# Patient Record
Sex: Male | Born: 1975 | Race: White | Hispanic: No | Smoking: Former smoker
Health system: Southern US, Community
[De-identification: ages and names within clinical notes are randomized; demographics above are authoritative.]

## PROBLEM LIST (undated history)

## (undated) DIAGNOSIS — E119 Type 2 diabetes mellitus without complications: Secondary | ICD-10-CM

## (undated) HISTORY — DX: Type 2 diabetes mellitus without complications: E11.9

---

## 2022-01-06 ENCOUNTER — Ambulatory Visit: Payer: Self-pay

## 2022-01-16 ENCOUNTER — Inpatient Hospital Stay
Admission: RE | Admit: 2022-01-16 | Discharge: 2022-01-29 | Disposition: A | Discharge status: Home / Self Care | Payer: BC Managed Care – PPO

## 2022-01-16 DIAGNOSIS — R609 Edema, unspecified: Secondary | ICD-10-CM

## 2022-01-16 LAB — VANCOMYCIN, RANDOM: Vancomycin Rm: 21

## 2022-01-17 LAB — COMPREHENSIVE METABOLIC PANEL
ALT: 28 U/L (ref 0–44)
AST: 33 U/L (ref 15–41)
Albumin: 1.7 g/dL — ABNORMAL LOW (ref 3.5–5.0)
Alkaline Phosphatase: 65 U/L (ref 38–126)
Anion gap: 8 (ref 5–15)
BUN: <5 mg/dL — ABNORMAL LOW (ref 6–20)
CO2: 30 mmol/L (ref 22–32)
Calcium: 7.9 mg/dL — ABNORMAL LOW (ref 8.9–10.3)
Chloride: 98 mmol/L (ref 98–111)
Creatinine, Ser: 0.63 mg/dL (ref 0.61–1.24)
GFR, Estimated: >60 mL/min (ref >60)
Glucose, Bld: 125 mg/dL — ABNORMAL HIGH (ref 70–99)
Potassium: 3.5 mmol/L (ref 3.5–5.1)
Sodium: 136 mmol/L (ref 135–145)
Total Bilirubin: 0.4 mg/dL (ref 0.3–1.2)
Total Protein: 6.5 g/dL (ref 6.5–8.1)

## 2022-01-17 LAB — CBC WITH DIFFERENTIAL/PLATELET
Abs Immature Granulocytes: 0.07 10*3/uL (ref 0.00–0.07)
Basophils Absolute: 0.1 10*3/uL (ref 0.0–0.1)
Basophils Relative: 1 %
Eosinophils Absolute: 0.2 10*3/uL (ref 0.0–0.5)
Eosinophils Relative: 2 %
HCT: 30.8 % — ABNORMAL LOW (ref 39.0–52.0)
Hemoglobin: 9.6 g/dL — ABNORMAL LOW (ref 13.0–17.0)
Immature Granulocytes: 1 %
Lymphocytes Relative: 36 %
Lymphs Abs: 4.5 10*3/uL — ABNORMAL HIGH (ref 0.7–4.0)
MCH: 27.7 pg (ref 26.0–34.0)
MCHC: 31.2 g/dL (ref 30.0–36.0)
MCV: 88.8 fL (ref 80.0–100.0)
Monocytes Absolute: 1 10*3/uL (ref 0.1–1.0)
Monocytes Relative: 8 %
Neutro Abs: 6.6 10*3/uL (ref 1.7–7.7)
Neutrophils Relative %: 52 %
Platelets: 485 10*3/uL — ABNORMAL HIGH (ref 150–400)
RBC: 3.47 MIL/uL — ABNORMAL LOW (ref 4.22–5.81)
RDW: 14.1 % (ref 11.5–15.5)
WBC: 12.6 10*3/uL — ABNORMAL HIGH (ref 4.0–10.5)
nRBC: 0 % (ref 0.0–0.2)

## 2022-01-18 LAB — VANCOMYCIN, TROUGH: Vancomycin Tr: 13 ug/mL — ABNORMAL LOW (ref 15–20)

## 2022-01-18 LAB — HEMOGLOBIN A1C
Hgb A1c MFr Bld: 11.4 % — ABNORMAL HIGH (ref 4.8–5.6)
Mean Plasma Glucose: 280 mg/dL

## 2022-01-19 LAB — HEMOGLOBIN A1C
Hgb A1c MFr Bld: 10.4 % — ABNORMAL HIGH (ref 4.8–5.6)
Mean Plasma Glucose: 251.78 mg/dL

## 2022-01-19 LAB — CBC
HCT: 31.7 % — ABNORMAL LOW (ref 39.0–52.0)
Hemoglobin: 10.2 g/dL — ABNORMAL LOW (ref 13.0–17.0)
MCH: 28.3 pg (ref 26.0–34.0)
MCHC: 32.2 g/dL (ref 30.0–36.0)
MCV: 87.8 fL (ref 80.0–100.0)
Platelets: 448 10*3/uL — ABNORMAL HIGH (ref 150–400)
RBC: 3.61 MIL/uL — ABNORMAL LOW (ref 4.22–5.81)
RDW: 13.7 % (ref 11.5–15.5)
WBC: 11.5 10*3/uL — ABNORMAL HIGH (ref 4.0–10.5)
nRBC: 0 % (ref 0.0–0.2)

## 2022-01-19 LAB — BASIC METABOLIC PANEL
Anion gap: 5 (ref 5–15)
BUN: 7 mg/dL (ref 6–20)
CO2: 31 mmol/L (ref 22–32)
Calcium: 8.1 mg/dL — ABNORMAL LOW (ref 8.9–10.3)
Chloride: 100 mmol/L (ref 98–111)
Creatinine, Ser: 0.57 mg/dL — ABNORMAL LOW (ref 0.61–1.24)
GFR, Estimated: >60 mL/min (ref >60)
Glucose, Bld: 143 mg/dL — ABNORMAL HIGH (ref 70–99)
Potassium: 4.2 mmol/L (ref 3.5–5.1)
Sodium: 136 mmol/L (ref 135–145)

## 2022-01-22 LAB — CBC
HCT: 34.6 % — ABNORMAL LOW (ref 39.0–52.0)
Hemoglobin: 11.3 g/dL — ABNORMAL LOW (ref 13.0–17.0)
MCH: 28.7 pg (ref 26.0–34.0)
MCHC: 32.7 g/dL (ref 30.0–36.0)
MCV: 87.8 fL (ref 80.0–100.0)
Platelets: 460 10*3/uL — ABNORMAL HIGH (ref 150–400)
RBC: 3.94 MIL/uL — ABNORMAL LOW (ref 4.22–5.81)
RDW: 13.7 % (ref 11.5–15.5)
WBC: 8.9 10*3/uL (ref 4.0–10.5)
nRBC: 0 % (ref 0.0–0.2)

## 2022-01-22 LAB — BASIC METABOLIC PANEL
Anion gap: 6 (ref 5–15)
BUN: <5 mg/dL — ABNORMAL LOW (ref 6–20)
CO2: 31 mmol/L (ref 22–32)
Calcium: 8.4 mg/dL — ABNORMAL LOW (ref 8.9–10.3)
Chloride: 99 mmol/L (ref 98–111)
Creatinine, Ser: 0.58 mg/dL — ABNORMAL LOW (ref 0.61–1.24)
GFR, Estimated: >60 mL/min (ref >60)
Glucose, Bld: 158 mg/dL — ABNORMAL HIGH (ref 70–99)
Potassium: 4.2 mmol/L (ref 3.5–5.1)
Sodium: 136 mmol/L (ref 135–145)

## 2022-01-22 LAB — VANCOMYCIN, TROUGH: Vancomycin Tr: 13 ug/mL — ABNORMAL LOW (ref 15–20)

## 2022-01-24 ENCOUNTER — Other Ambulatory Visit (HOSPITAL_COMMUNITY): Payer: BC Managed Care – PPO

## 2022-01-26 LAB — BASIC METABOLIC PANEL
Anion gap: 9 (ref 5–15)
BUN: 7 mg/dL (ref 6–20)
CO2: 29 mmol/L (ref 22–32)
Calcium: 8.3 mg/dL — ABNORMAL LOW (ref 8.9–10.3)
Chloride: 99 mmol/L (ref 98–111)
Creatinine, Ser: 0.58 mg/dL — ABNORMAL LOW (ref 0.61–1.24)
GFR, Estimated: >60 mL/min (ref >60)
Glucose, Bld: 101 mg/dL — ABNORMAL HIGH (ref 70–99)
Potassium: 4 mmol/L (ref 3.5–5.1)
Sodium: 137 mmol/L (ref 135–145)

## 2022-01-26 LAB — CBC
HCT: 33.2 % — ABNORMAL LOW (ref 39.0–52.0)
Hemoglobin: 10.6 g/dL — ABNORMAL LOW (ref 13.0–17.0)
MCH: 27.7 pg (ref 26.0–34.0)
MCHC: 31.9 g/dL (ref 30.0–36.0)
MCV: 86.9 fL (ref 80.0–100.0)
Platelets: 339 10*3/uL (ref 150–400)
RBC: 3.82 MIL/uL — ABNORMAL LOW (ref 4.22–5.81)
RDW: 13.5 % (ref 11.5–15.5)
WBC: 8.7 10*3/uL (ref 4.0–10.5)
nRBC: 0 % (ref 0.0–0.2)

## 2022-01-29 LAB — VANCOMYCIN, TROUGH: Vancomycin Tr: 14 ug/mL — ABNORMAL LOW (ref 15–20)

## 2022-02-20 ENCOUNTER — Encounter: Payer: Self-pay | Admitting: "Endocrinology

## 2022-02-20 ENCOUNTER — Ambulatory Visit: Payer: BC Managed Care – PPO | Admitting: "Endocrinology

## 2022-02-20 VITALS — BP 96/64 | HR 99 | Ht 73.0 in | Wt 254.6 lb

## 2022-02-20 DIAGNOSIS — E782 Mixed hyperlipidemia: Secondary | ICD-10-CM

## 2022-02-20 DIAGNOSIS — E6609 Other obesity due to excess calories: Secondary | ICD-10-CM

## 2022-02-20 DIAGNOSIS — E1165 Type 2 diabetes mellitus with hyperglycemia: Secondary | ICD-10-CM | POA: Diagnosis not present

## 2022-02-20 DIAGNOSIS — Z6833 Body mass index (BMI) 33.0-33.9, adult: Secondary | ICD-10-CM

## 2022-02-20 NOTE — Progress Notes (Signed)
Endocrinology Consult Note       02/20/2022, 4:46 PM   Subjective:    Patient ID: Dakota Matthews, male    DOB: [birth year].  Dakota Matthews is being seen in consultation for management of currently uncontrolled symptomatic diabetes requested by  Wilburt Finlay, MD.   Past Medical History:  Diagnosis Date   Diabetes [facility])     History reviewed. No pertinent surgical history.  Social History   Socioeconomic History   Marital status: Divorced    Spouse name: Not on file   Number of children: Not on file   Years of education: Not on file   Highest education level: Not on file  Occupational History   Not on file  Tobacco Use   Smoking status: Former    Types: Cigarettes    Quit date: 04/23/2021    Years since quitting: 0.8   Smokeless tobacco: Current  Vaping Use   Vaping Use: Never used  Substance and Sexual Activity   Alcohol use: Yes    Alcohol/week: 1.0 standard drink    Types: 1 Cans of beer per week    Comment: Occ, stopped in May   Drug use: Never   Sexual activity: Not on file  Other Topics Concern   Not on file  Social History Narrative   Not on file   Social Determinants of Health   Financial Resource Strain: Not on file  Food Insecurity: Not on file  Transportation Needs: Not on file  Physical Activity: Not on file  Stress: Not on file  Social Connections: Not on file    Family History  Problem Relation Age of Onset   Cancer Father     Outpatient Encounter Medications as of 02/20/2022  Medication Sig   atorvastatin (LIPITOR) 20 MG tablet Take 20 mg by mouth daily.   BD INSULIN SYRINGE U/F 30G X 1/2" 0.5 ML MISC USE ONCE DAILY WITH INSULIN   ceFEPIme 2 g in sodium chloride 0.9 % 100 mL Inject 2 g into the vein every 12 (twelve) hours.   Docusate Sodium (DSS) 100 MG CAPS Take by mouth.   ELIQUIS 5 MG TABS tablet Take 5 mg by mouth 2 (two) times daily.   Famotidine (PEPCID  PO) Take by mouth as needed.   HYDROcodone-acetaminophen (NORCO/VICODIN) 5-325 MG tablet Take 1 tablet by mouth every 6 (six) hours as needed.   insulin glargine (LANTUS) 100 UNIT/ML injection Inject 20 Units into the skin at bedtime.   metFORMIN (GLUCOPHAGE) 500 MG tablet Take 1,000 mg by mouth 2 (two) times daily.   VANCOMYCIN HCL PO Take by mouth. Every 12 hours   vitamin C (ASCORBIC ACID) 500 MG tablet Take 500 mg by mouth daily.   Zinc 50 MG TABS Take 1 tablet by mouth daily.   No facility-administered encounter medications on file as of 02/20/2022.    ALLERGIES: No Known Allergies  VACCINATION STATUS:  There is no immunization history on file for this patient.  Diabetes He presents for his initial diabetic visit. He has type 2 diabetes mellitus. Onset time: He was diagnosed in January 2023 at age of 19. There  are no hypoglycemic associated symptoms. Pertinent negatives for hypoglycemia include no confusion, headaches, pallor or seizures. Associated symptoms include polydipsia and polyuria. Pertinent negatives for diabetes include no chest pain, no fatigue, no polyphagia and no weakness. There are no hypoglycemic complications. Symptoms are improving. Diabetic complications include peripheral neuropathy and PVD. (Patient presented with left foot diabetic ulcer which was complicated as osteomyelitis requiring several weeks of antibiotic treatment.  He is currently on a wheelchair due to deconditioning from this incident.  He was discharged on insulin treatment which helped control his glycemic profile towards target.) Risk factors for coronary artery disease include dyslipidemia, diabetes mellitus, male sex, obesity, sedentary lifestyle and tobacco exposure. Current diabetic treatments: He is on Lantus 30 units nightly and metformin 1000 mg twice a day. His weight is fluctuating minimally. He is following a generally unhealthy diet. When asked about meal planning, he reported none. He has not  had a previous visit with a dietitian. He never participates in exercise. His breakfast blood glucose range is generally 110-130 mg/dl. His bedtime blood glucose range is generally 130-140 mg/dl. His overall blood glucose range is 130-140 mg/dl. (His most recent glycemic profile is controlled averaging 125-150 mg per DL.  No major hypoglycemia.) An ACE inhibitor/angiotensin II receptor blocker is not being taken.  Hyperlipidemia This is a chronic problem. The current episode started more than 1 month ago. Exacerbating diseases include diabetes and obesity. Pertinent negatives include no chest pain, myalgias or shortness of breath. Current antihyperlipidemic treatment includes statins. Risk factors for coronary artery disease include diabetes mellitus, dyslipidemia, family history, obesity, male sex, hypertension and a sedentary lifestyle.    Review of Systems  Constitutional:  Negative for chills, fatigue, fever and unexpected weight change.  HENT:  Negative for dental problem, mouth sores and trouble swallowing.   Eyes:  Negative for visual disturbance.  Respiratory:  Negative for cough, choking, chest tightness, shortness of breath and wheezing.   Cardiovascular:  Negative for chest pain, palpitations and leg swelling.  Gastrointestinal:  Negative for abdominal distention, abdominal pain, constipation, diarrhea, nausea and vomiting.  Endocrine: Positive for polydipsia and polyuria. Negative for polyphagia.  Genitourinary:  Negative for dysuria, flank pain, hematuria and urgency.  Musculoskeletal:  Negative for back pain, gait problem, myalgias and neck pain.       Patient is on a wheelchair due to his recent lower extremity cellulitis/osteomyelitis.  Skin:  Negative for pallor, rash and wound.  Neurological:  Negative for seizures, syncope, weakness, numbness and headaches.  Psychiatric/Behavioral:  Negative for confusion and dysphoric mood.    Objective:    Vitals with BMI 02/20/2022  Height  6\' 1"   Weight 254 lbs 10 oz  BMI 123XX123  Systolic 96  Diastolic 64  Pulse 99    BP 96/64    Pulse 99    Ht 6\' 1"  (1.854 m)    Wt 254 lb 9.6 oz (115.5 kg)    SpO2 100%    BMI 33.59 kg/m   Wt Readings from Last 3 Encounters:  02/20/22 254 lb 9.6 oz (115.5 kg)     Physical Exam Constitutional:      General: He is not in acute distress.    Appearance: He is well-developed.  HENT:     Head: Normocephalic and atraumatic.  Neck:     Thyroid: No thyromegaly.     Trachea: No tracheal deviation.  Cardiovascular:     Rate and Rhythm: Normal rate.     Pulses:  Dorsalis pedis pulses are 1+ on the right side and 1+ on the left side.       Posterior tibial pulses are 1+ on the right side and 1+ on the left side.     Heart sounds: Normal heart sounds, S1 normal and S2 normal. No murmur heard.   No gallop.  Pulmonary:     Effort: Pulmonary effort is normal. No respiratory distress.     Breath sounds: No wheezing.  Abdominal:     General: Bowel sounds are normal. There is no distension.     Palpations: Abdomen is soft.     Tenderness: There is no abdominal tenderness. There is no guarding.  Musculoskeletal:     Right shoulder: No swelling or deformity.     Cervical back: Normal range of motion and neck supple.     Comments: Bilateral lower extremity dressed and bandaged due to complicated diabetic foot ulcer by osteomyelitis.  He is currently on antibiotic treatment.  Skin:    General: Skin is warm and dry.     Findings: No rash.     Nails: There is no clubbing.  Neurological:     Mental Status: He is alert and oriented to person, place, and time.     Cranial Nerves: No cranial nerve deficit.     Sensory: No sensory deficit.     Gait: Gait normal.     Deep Tendon Reflexes: Reflexes are normal and symmetric.  Psychiatric:        Speech: Speech normal.        Behavior: Behavior normal. Behavior is cooperative.        Thought Content: Thought content normal.        Judgment:  Judgment normal.      CMP ( most recent) CMP     Component Value Date/Time   NA 137 01/26/2022 0400   K 4.0 01/26/2022 0400   CL 99 01/26/2022 0400   CO2 29 01/26/2022 0400   GLUCOSE 101 (H) 01/26/2022 0400   BUN 7 01/26/2022 0400   CREATININE 0.58 (L) 01/26/2022 0400   CALCIUM 8.3 (L) 01/26/2022 0400   PROT 6.5 01/17/2022 0434   ALBUMIN 1.7 (L) 01/17/2022 0434   AST 33 01/17/2022 0434   ALT 28 01/17/2022 0434   ALKPHOS 65 01/17/2022 0434   BILITOT 0.4 01/17/2022 0434   GFRNONAA >60 01/26/2022 0400     Diabetic Labs (most recent): Lab Results  Component Value Date   HGBA1C 10.4 (H) 01/19/2022   HGBA1C 11.4 (H) 01/17/2022     Assessment & Plan:   1. Poorly controlled type 2 diabetes mellitus ([place])   - Donique Billick has currently uncontrolled symptomatic type 2 DM since  46 years of age,  with most recent A1c of 10.4 %. Recent labs reviewed. He was diagnosed with diabetes with A1c of 11.4% January 2023.  Admittedly, he has not seen primary care doctor in several years prior to that. - I had a long discussion with him about the progressive nature of diabetes and the pathology behind its complications. -his diabetes is complicated by diabetic foot ulcer complicated by osteomyelitis requiring debridement as well as ongoing antibiotic treatment and he remains at a high risk for more acute and chronic complications which include CAD, CVA, CKD, retinopathy, and neuropathy. These are all discussed in detail with him.  - I discussed all available options of managing his diabetes including de-escalation of medications. I have counseled him on diet  and weight management  by adopting a Whole Food , Plant Predominant  ( WFPP) nutrition as recommended by [employer] of Lifestyle Medicine. Patient is encouraged to switch to  unprocessed or minimally processed  complex starch, adequate protein intake (mainly plant source), minimal liquid fat ( mainly vegetable oils), plenty of  fruits, and vegetables. -  he is advised to stick to a routine mealtimes to eat 3 complete meals a day and snack only when necessary ( to snack only to correct hypoglycemia BG <70 day time or <100 at night).   - he acknowledges that there is a room for improvement in his food and drink choices. - Further Specific Suggestion is made for him to avoid simple carbohydrates  from his diet including Cakes, Sweet Desserts, Ice Cream, Soda (diet and regular), Sweet Tea, Candies, Chips, Cookies, Store Bought Juices, Alcohol ,  Artificial Sweeteners,  Coffee Creamer, and "Sugar-free" Products. This will help patient to have more stable blood glucose profile and potentially avoid unintended weight gain.  The following Lifestyle Medicine recommendations according to [facility]) were discussed and offered to patient and he agrees to start the journey:  A. Whole Foods, Plant-based plate comprising of fruits and vegetables, plant-based proteins, whole-grain carbohydrates was discussed in detail with the patient.   A list for source of those nutrients were also provided to the patient.  Patient will use only water or unsweetened tea for hydration. B.  The need to stay away from risky substances including alcohol, smoking; obtaining 7 to 9 hours of restorative sleep, at least 150 minutes of moderate intensity exercise weekly, the importance of healthy social connections,  and stress reduction techniques were discussed. C.  A full color page of  Calorie density of various food groups per pound showing examples of each food groups was provided to the patient.  - he will be scheduled with Jearld Fenton, RDN, CDE for individualized diabetes education.  - I have approached him with the following plan to manage  his diabetes and patient agrees:   -In light of his presentation with near target glycemic profile, he will not need prandial insulin for now.  He is advised to lower his Lantus to  20 units nightly, associated with monitoring of blood glucose twice a day-daily before breakfast and at bedtime.  - he is encouraged to call clinic for blood glucose levels less than 70 or above 200 mg /dl. - he is advised to continue metformin 1000 mg p.o. twice daily, therapeutically suitable for patient .  - he will be considered for incretin therapy as appropriate next visit.  - Specific targets for  A1c;  LDL, HDL,  and Triglycerides were discussed with the patient.  2) Blood Pressure /Hypertension:  his blood pressure is  controlled to target.  He is not on antihypertensive medications due to marginal blood pressure.   3) Lipids/Hyperlipidemia: I do not have his recent lipid panel to review.  He is on statin, atorvastatin 20 mg p.o. nightly.  He is advised to continue.    Side effects and precautions discussed with him.  The detailed WF PB diet above will help with dyslipidemia as well.  4)  Weight/Diet:  Body mass index is 33.59 kg/m.  -   clearly complicating his diabetes care.   he is  a candidate for weight loss. I discussed with him the fact that loss of 5 - 10% of his  current body weight will have the most impact on his diabetes management.  The above detailed  ACLM recommendations for nutrition, exercise, sleep, social life, avoidance of risky substances, the need for restorative sleep   information will also detailed on discharge instructions.  5) Chronic Care/Health Maintenance:  -he  is Statin medications and  is encouraged to initiate and continue to follow up with Ophthalmology, Dentist,  Podiatrist at least yearly or according to recommendations, and advised to   stay away from smoking. I have recommended yearly flu vaccine and pneumonia vaccine at least every 5 years; moderate intensity exercise for up to 150 minutes weekly; and  sleep for 7- 9 hours a day.  - he is  advised to maintain close follow up with [place], Tyler Deis, MD for primary care needs, as well as his other  providers for optimal and coordinated care.   I spent 66 minutes in the care of the patient today including review of labs from [facility], Lipids, Thyroid Function, Hematology (current and previous including abstractions from other facilities); face-to-face time discussing  his blood glucose readings/logs, discussing hypoglycemia and hyperglycemia episodes and symptoms, medications doses, his options of short and long term treatment based on the latest standards of care / guidelines;  discussion about incorporating lifestyle medicine;  and documenting the encounter.     Please refer to Patient Instructions for Blood Glucose Monitoring and Insulin/Medications Dosing Guide"  in media tab for additional information. Please  also refer to " Patient Self Inventory" in the Media  tab for reviewed elements of pertinent patient history.  Debera Lat participated in the discussions, expressed understanding, and voiced agreement with the above plans.  All questions were answered to his satisfaction. he is encouraged to contact clinic should he have any questions or concerns prior to his return visit.   Follow up plan: - Return in about 9 weeks (around 04/24/2022) for F/U with Pre-visit Labs, Meter, Logs, A1c here.Glade Lloyd, MD [facility] [address] [place], [place] [address] Phone: [phone]  Fax: [phone]    02/20/2022, 4:46 PM  This note was partially dictated with voice recognition software. Similar sounding words can be transcribed inadequately or may not  be corrected upon review.

## 2022-02-20 NOTE — Patient Instructions (Signed)

## 2022-03-08 ENCOUNTER — Telehealth: Payer: Self-pay | Admitting: "Endocrinology

## 2022-03-08 NOTE — Telephone Encounter (Signed)
Patient's caregiver, [name] called and said someone from the office called his sister yesterday about a RX being sent in for his Lantus 20 units but nothing was called in. I do not see anything in the chart regarding this. She said that she only has enough to give him for tonight. Can this be called into [facility]. If you have any questions please call [name] his caregiver at [phone] ?

## 2022-03-08 NOTE — Telephone Encounter (Signed)
Left a message requesting pt's caregiver [name] return call to the office. ?

## 2022-03-09 ENCOUNTER — Other Ambulatory Visit: Payer: Self-pay

## 2022-03-09 DIAGNOSIS — E1165 Type 2 diabetes mellitus with hyperglycemia: Secondary | ICD-10-CM

## 2022-03-09 MED ORDER — INSULIN GLARGINE 100 UNIT/ML SOLOSTAR PEN: 20.0000 [IU] | PEN_INJECTOR | Freq: Every day | SUBCUTANEOUS | 1 refills | Status: DC

## 2022-03-09 MED ORDER — INSULIN GLARGINE 100 UNIT/ML ~~LOC~~ SOLN: 20.0000 [IU] | Freq: Every day | SUBCUTANEOUS | 1 refills | Status: DC

## 2022-03-09 MED ORDER — PEN NEEDLES 31G X 8 MM MISC: 1 refills | Status: DC

## 2022-03-09 NOTE — Telephone Encounter (Signed)
Talked with pt's caregiver, Rx for lantus solostar pen and pen needles sent in to [facility]. ?

## 2022-03-09 NOTE — Telephone Encounter (Signed)
[name] would like a call back at [phone] ?

## 2022-03-24 HISTORY — PX: BELOW KNEE LEG AMPUTATION: SUR23

## 2022-04-20 LAB — COMPREHENSIVE METABOLIC PANEL
ALT: 27 IU/L (ref 0–44)
AST: 22 IU/L (ref 0–40)
Albumin/Globulin Ratio: 1.7 (ref 1.2–2.2)
Albumin: 4.3 g/dL (ref 4.0–5.0)
Alkaline Phosphatase: 63 IU/L (ref 44–121)
BUN/Creatinine Ratio: 17 (ref 9–20)
BUN: 14 mg/dL (ref 6–24)
Bilirubin Total: 0.4 mg/dL (ref 0.0–1.2)
CO2: 26 mmol/L (ref 20–29)
Calcium: 9.4 mg/dL (ref 8.7–10.2)
Chloride: 101 mmol/L (ref 96–106)
Creatinine, Ser: 0.82 mg/dL (ref 0.76–1.27)
Globulin, Total: 2.6 g/dL (ref 1.5–4.5)
Glucose: 95 mg/dL (ref 70–99)
Potassium: 4.3 mmol/L (ref 3.5–5.2)
Sodium: 140 mmol/L (ref 134–144)
Total Protein: 6.9 g/dL (ref 6.0–8.5)
eGFR: 110 mL/min/{1.73_m2} (ref >59)

## 2022-04-20 LAB — LIPID PANEL
Chol/HDL Ratio: 2.5 ratio (ref 0.0–5.0)
Cholesterol, Total: 99 mg/dL — ABNORMAL LOW (ref 100–199)
HDL: 39 mg/dL — ABNORMAL LOW (ref >39)
LDL Chol Calc (NIH): 39 mg/dL (ref 0–99)
Triglycerides: 118 mg/dL (ref 0–149)
VLDL Cholesterol Cal: 21 mg/dL (ref 5–40)

## 2022-04-20 LAB — T4, FREE: Free T4: 0.92 ng/dL (ref 0.82–1.77)

## 2022-04-20 LAB — TSH: TSH: 9.36 u[IU]/mL — ABNORMAL HIGH (ref 0.450–4.500)

## 2022-04-20 LAB — VITAMIN D 25 HYDROXY (VIT D DEFICIENCY, FRACTURES): Vit D, 25-Hydroxy: 26.4 ng/mL — ABNORMAL LOW (ref 30.0–100.0)

## 2022-04-24 ENCOUNTER — Ambulatory Visit (INDEPENDENT_AMBULATORY_CARE_PROVIDER_SITE_OTHER): Payer: BC Managed Care – PPO | Admitting: "Endocrinology

## 2022-04-24 ENCOUNTER — Encounter: Payer: Self-pay | Admitting: "Endocrinology

## 2022-04-24 VITALS — BP 116/82 | HR 92 | Ht 73.0 in

## 2022-04-24 DIAGNOSIS — E782 Mixed hyperlipidemia: Secondary | ICD-10-CM

## 2022-04-24 DIAGNOSIS — E6609 Other obesity due to excess calories: Secondary | ICD-10-CM

## 2022-04-24 DIAGNOSIS — E1165 Type 2 diabetes mellitus with hyperglycemia: Secondary | ICD-10-CM

## 2022-04-24 DIAGNOSIS — Z6833 Body mass index (BMI) 33.0-33.9, adult: Secondary | ICD-10-CM

## 2022-04-24 DIAGNOSIS — E039 Hypothyroidism, unspecified: Secondary | ICD-10-CM | POA: Diagnosis not present

## 2022-04-24 MED ORDER — LEVOTHYROXINE SODIUM 25 MCG PO TABS
25.0000 ug | ORAL_TABLET | Freq: Every day | ORAL | 1 refills | Status: DC
Start: 2022-04-24 — End: 2022-08-03

## 2022-04-24 NOTE — Progress Notes (Signed)
? ?                                                 ?     04/24/2022, 6:53 PM ? ?Endocrinology follow-up note ? ? ?Subjective:  ? ? Patient ID: Dakota Matthews, male    DOB: 02/12/1976.  ?Dakota Matthews is being seen in follow-up after he was seen in consultation for management of currently uncontrolled symptomatic diabetes requested by  Beatrix FettersKotturi, Vinay K, MD. ? ? ?Past Medical History:  ?Diagnosis Date  ? Diabetes (HCC)   ? ? ?Past Surgical History:  ?Procedure Laterality Date  ? BELOW KNEE LEG AMPUTATION Left 03/2022  ? ? ?Social History  ? ?Socioeconomic History  ? Marital status: Divorced  ?  Spouse name: Not on file  ? Number of children: Not on file  ? Years of education: Not on file  ? Highest education level: Not on file  ?Occupational History  ? Not on file  ?Tobacco Use  ? Smoking status: Former  ?  Types: Cigarettes  ?  Quit date: 04/23/2021  ?  Years since quitting: 1.0  ? Smokeless tobacco: Current  ?Vaping Use  ? Vaping Use: Never used  ?Substance and Sexual Activity  ? Alcohol use: Yes  ?  Alcohol/week: 1.0 standard drink  ?  Types: 1 Cans of beer per week  ?  Comment: Occ, stopped in May  ? Drug use: Never  ? Sexual activity: Not on file  ?Other Topics Concern  ? Not on file  ?Social History Narrative  ? Not on file  ? ?Social Determinants of Health  ? ?Financial Resource Strain: Not on file  ?Food Insecurity: Not on file  ?Transportation Needs: Not on file  ?Physical Activity: Not on file  ?Stress: Not on file  ?Social Connections: Not on file  ? ? ?Family History  ?Problem Relation Age of Onset  ? Cancer Father   ? ? ?Outpatient Encounter Medications as of 04/24/2022  ?Medication Sig  ? levothyroxine (SYNTHROID) 25 MCG tablet Take 1 tablet (25 mcg total) by mouth daily before breakfast.  ? [DISCONTINUED] Insulin Glargine (BASAGLAR KWIKPEN) 100 UNIT/ML Inject 10 Units into the skin at bedtime.  ? atorvastatin (LIPITOR) 20 MG tablet Take 20 mg by mouth daily.  ? BD INSULIN SYRINGE U/F 30G X  1/2" 0.5 ML MISC USE ONCE DAILY WITH INSULIN  ? ceFEPIme 2 g in sodium chloride 0.9 % 100 mL Inject 2 g into the vein every 12 (twelve) hours.  ? Docusate Sodium (DSS) 100 MG CAPS Take by mouth.  ? HYDROcodone-acetaminophen (NORCO/VICODIN) 5-325 MG tablet Take 1 tablet by mouth every 6 (six) hours as needed.  ? Insulin Pen Needle (PEN NEEDLES) 31G X 8 MM MISC Use to inject insulin daily at bedtime  ? metFORMIN (GLUCOPHAGE) 500 MG tablet Take 1,000 mg by mouth 2 (two) times daily.  ? vitamin C (ASCORBIC ACID) 500 MG tablet Take 500 mg by mouth daily.  ? [DISCONTINUED] ELIQUIS 5 MG TABS tablet Take 5 mg by mouth 2 (two) times daily.  ? [DISCONTINUED] Famotidine (PEPCID PO) Take by mouth as needed.  ? [DISCONTINUED] insulin glargine (LANTUS) 100 UNIT/ML injection Inject 0.2 mLs (20 Units total) into the skin at bedtime.  ? [DISCONTINUED] VANCOMYCIN HCL PO Take by mouth. Every 12 hours  ? [DISCONTINUED] Zinc 50 MG  TABS Take 1 tablet by mouth daily.  ? ?No facility-administered encounter medications on file as of 04/24/2022.  ? ? ?ALLERGIES: ?No Known Allergies ? ?VACCINATION STATUS: ? ?There is no immunization history on file for this patient. ? ?Diabetes ?He presents for his follow-up diabetic visit. He has type 2 diabetes mellitus. Onset time: He was diagnosed in January 2023 at age of 77. 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 There are no hypoglycemic associated symptoms. Pertinent negatives for hypoglycemia include no confusion, headaches, pallor or seizures. Pertinent negatives for diabetes include no chest pain, no fatigue, no polydipsia, no polyphagia, no polyuria and no weakness. There are no hypoglycemic complications. Symptoms are improving. Diabetic complications include peripheral neuropathy and PVD. (He underwent left BKA after complicated osteomyelitis on March 20, 2022.) Risk factors for coronary artery  disease include dyslipidemia, diabetes mellitus, male sex, obesity, sedentary lifestyle and tobacco exposure. Current diabetic treatments: He is on Lantus 30 units nightly and metformin 1000 mg twice a day. His weight is decreasing steadily (A drop of 34 pounds since last visit including left BKA in the interval.). He is following a generally unhealthy diet. When asked about meal planning, he reported none. He has not had a previous visit with a dietitian. He never participates in exercise. His home blood glucose trend is decreasing steadily. His breakfast blood glucose range is generally 90-110 mg/dl. His bedtime blood glucose range is generally 90-110 mg/dl. His overall blood glucose range is 90-110 mg/dl. (This patient presents with significant improvement in his glycemic profile and previsit labs showing A1c of 6.3% improving from 10.4%.  He continues to lose weight down to 220 pounds.  A drop of 34 pounds including left BKA in the interval.  He did not document any hypoglycemia.) An ACE inhibitor/angiotensin II receptor blocker is not being taken.  ?Hyperlipidemia ?This is a chronic problem. The current episode started more than 1 month ago. Exacerbating diseases include diabetes and obesity. Pertinent negatives include no chest pain, myalgias or shortness of breath. Current antihyperlipidemic treatment includes statins. Risk factors for coronary artery disease include diabetes mellitus, dyslipidemia, family history, obesity, male sex, hypertension and a sedentary lifestyle.  ? ? ?Review of Systems  ?Constitutional:  Negative for chills, fatigue, fever and unexpected weight change.  ?HENT:  Negative for dental problem, mouth sores and trouble swallowing.   ?Eyes:  Negative for visual disturbance.  ?Respiratory:  Negative for cough, choking, chest tightness, shortness of breath and wheezing.   ?Cardiovascular:  Negative for chest pain, palpitations and leg swelling.  ?Gastrointestinal:  Negative for abdominal  distention, abdominal pain, constipation, diarrhea, nausea and vomiting.  ?Endocrine: Negative for polydipsia, polyphagia and polyuria.  ?Genitourinary:  Negative for dysuria, flank pain, hematuria and urgency.  ?Musculoskeletal:  Negative for back pain, gait problem, myalgias and neck pain.  ?     Patient is on a wheelchair due to his recent lower extremity cellulitis/osteomyelitis.  ?Skin:  Negative for pallor, rash and wound.  ?Neurological:  Negative for seizures, syncope, weakness, numbness and headaches.  ?Psychiatric/Behavioral:  Negative for confusion and dysphoric mood.   ? ?Objective:  ?  ? ?  04/24/2022  ?  2:34 PM 02/20/2022  ?  3:09 PM  ?Vitals with BMI  ?Height 6\' 1"  6\' 1"   ?Weight  254 lbs 10 oz  ?BMI  33.6  ?Systolic 116 96  ?Diastolic 82 64  ?Pulse 92 99  ? ? ?  BP 116/82   Pulse 92   Ht 6\' 1"  (1.854 m)   BMI 33.59 kg/m?   ?Wt Readings from Last 3 Encounters:  ?02/20/22 254 lb 9.6 oz (115.5 kg)  ?  ? ?Physical Exam ?Constitutional:   ?   General: He is not in acute distress. ?   Appearance: He is well-developed.  ?HENT:  ?   Head: Normocephalic and atraumatic.  ?Neck:  ?   Thyroid: No thyromegaly.  ?   Trachea: No tracheal deviation.  ?Cardiovascular:  ?   Rate and Rhythm: Normal rate.  ?   Pulses:     ?     Dorsalis pedis pulses are 1+ on the right side and 1+ on the left side.  ?     Posterior tibial pulses are 1+ on the right side and 1+ on the left side.  ?   Heart sounds: Normal heart sounds, S1 normal and S2 normal. No murmur heard. ?  No gallop.  ?Pulmonary:  ?   Effort: Pulmonary effort is normal. No respiratory distress.  ?   Breath sounds: No wheezing.  ?Abdominal:  ?   General: Bowel sounds are normal. There is no distension.  ?   Palpations: Abdomen is soft.  ?   Tenderness: There is no abdominal tenderness. There is no guarding.  ?Musculoskeletal:  ?   Right shoulder: No swelling or deformity.  ?   Cervical back: Normal range of motion and neck supple.  ?   Comments: Wheelchair-bound due  to recent left BKA due to complicated osteomyelitis. ?  ?Skin: ?   General: Skin is warm and dry.  ?   Findings: No rash.  ?   Nails: There is no clubbing.  ?Neurological:  ?   Mental Status: He is alert and ori

## 2022-04-24 NOTE — Patient Instructions (Signed)

## 2022-05-12 DIAGNOSIS — M8619 Other acute osteomyelitis, multiple sites: Secondary | ICD-10-CM | POA: Diagnosis not present

## 2022-06-05 DIAGNOSIS — Z89512 Acquired absence of left leg below knee: Secondary | ICD-10-CM | POA: Diagnosis not present

## 2022-06-05 DIAGNOSIS — M14672 Charcot's joint, left ankle and foot: Secondary | ICD-10-CM | POA: Diagnosis not present

## 2022-06-12 DIAGNOSIS — M8619 Other acute osteomyelitis, multiple sites: Secondary | ICD-10-CM | POA: Diagnosis not present

## 2022-07-27 DIAGNOSIS — E039 Hypothyroidism, unspecified: Secondary | ICD-10-CM | POA: Diagnosis not present

## 2022-07-28 LAB — T4, FREE: Free T4: 0.88 ng/dL (ref 0.82–1.77)

## 2022-07-28 LAB — TSH: TSH: 6.01 u[IU]/mL — ABNORMAL HIGH (ref 0.450–4.500)

## 2022-07-31 ENCOUNTER — Ambulatory Visit: Payer: BC Managed Care – PPO | Admitting: "Endocrinology

## 2022-08-03 ENCOUNTER — Ambulatory Visit (INDEPENDENT_AMBULATORY_CARE_PROVIDER_SITE_OTHER): Payer: BC Managed Care – PPO | Admitting: "Endocrinology

## 2022-08-03 ENCOUNTER — Encounter: Payer: Self-pay | Admitting: "Endocrinology

## 2022-08-03 VITALS — BP 152/94 | HR 72 | Ht 73.0 in | Wt 231.8 lb

## 2022-08-03 DIAGNOSIS — E6609 Other obesity due to excess calories: Secondary | ICD-10-CM | POA: Diagnosis not present

## 2022-08-03 DIAGNOSIS — E039 Hypothyroidism, unspecified: Secondary | ICD-10-CM | POA: Diagnosis not present

## 2022-08-03 DIAGNOSIS — E1165 Type 2 diabetes mellitus with hyperglycemia: Secondary | ICD-10-CM | POA: Diagnosis not present

## 2022-08-03 DIAGNOSIS — Z6833 Body mass index (BMI) 33.0-33.9, adult: Secondary | ICD-10-CM

## 2022-08-03 DIAGNOSIS — E782 Mixed hyperlipidemia: Secondary | ICD-10-CM | POA: Diagnosis not present

## 2022-08-03 LAB — POCT GLYCOSYLATED HEMOGLOBIN (HGB A1C): HbA1c, POC (controlled diabetic range): 5.7 % (ref 0.0–7.0)

## 2022-08-03 MED ORDER — LEVOTHYROXINE SODIUM 50 MCG PO TABS
50.0000 ug | ORAL_TABLET | Freq: Every day | ORAL | 1 refills | Status: DC
Start: 2022-08-03 — End: 2022-11-09

## 2022-08-03 NOTE — Patient Instructions (Signed)

## 2022-08-03 NOTE — Progress Notes (Signed)
08/03/2022, 2:45 PM  Endocrinology follow-up note   Subjective:    Patient ID: Dakota Matthews, male    DOB: 01-06-1976.  Dakota Matthews is being seen in follow-up after he was seen in consultation for management of currently uncontrolled symptomatic diabetes requested by  Beatrix Fetters, MD.   Past Medical History:  Diagnosis Date   Diabetes [facility])     Past Surgical History:  Procedure Laterality Date   BELOW KNEE LEG AMPUTATION Left 03/2022    Social History   Socioeconomic History   Marital status: Divorced    Spouse name: Not on file   Number of children: Not on file   Years of education: Not on file   Highest education level: Not on file  Occupational History   Not on file  Tobacco Use   Smoking status: Former    Types: Cigarettes    Quit date: 04/23/2021    Years since quitting: 1.2   Smokeless tobacco: Current  Vaping Use   Vaping Use: Never used  Substance and Sexual Activity   Alcohol use: Yes    Alcohol/week: 1.0 standard drink of alcohol    Types: 1 Cans of beer per week    Comment: Occ, stopped in May   Drug use: Never   Sexual activity: Not on file  Other Topics Concern   Not on file  Social History Narrative   Not on file   Social Determinants of Health   Financial Resource Strain: Not on file  Food Insecurity: Not on file  Transportation Needs: Not on file  Physical Activity: Not on file  Stress: Not on file  Social Connections: Not on file    Family History  Problem Relation Age of Onset   Cancer Father     Outpatient Encounter Medications as of 08/03/2022  Medication Sig   atorvastatin (LIPITOR) 20 MG tablet Take 20 mg by mouth daily.   BD INSULIN SYRINGE U/F 30G X 1/2" 0.5 ML MISC USE ONCE DAILY WITH INSULIN   ceFEPIme 2 g in sodium chloride 0.9 % 100 mL Inject 2 g into the vein every 12 (twelve) hours.   Docusate Sodium (DSS) 100 MG CAPS Take by mouth.    HYDROcodone-acetaminophen (NORCO/VICODIN) 5-325 MG tablet Take 1 tablet by mouth every 6 (six) hours as needed.   Insulin Pen Needle (PEN NEEDLES) 31G X 8 MM MISC Use to inject insulin daily at bedtime   levothyroxine (SYNTHROID) 50 MCG tablet Take 1 tablet (50 mcg total) by mouth daily before breakfast.   metFORMIN (GLUCOPHAGE) 500 MG tablet Take 500 mg by mouth daily after breakfast.   vitamin C (ASCORBIC ACID) 500 MG tablet Take 500 mg by mouth daily.   [DISCONTINUED] levothyroxine (SYNTHROID) 25 MCG tablet Take 1 tablet (25 mcg total) by mouth daily before breakfast.   No facility-administered encounter medications on file as of 08/03/2022.    ALLERGIES: No Known Allergies  VACCINATION STATUS:  There is no immunization history on file for this patient.  Diabetes He presents for his follow-up diabetic visit. He has type 2 diabetes mellitus. Onset time: He was diagnosed in January 2023 at age of 46. 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 There are no hypoglycemic associated symptoms. Pertinent negatives for hypoglycemia include no confusion, headaches, pallor or seizures. Pertinent negatives for diabetes include no chest pain, no fatigue, no polydipsia, no polyphagia, no polyuria and no weakness. There are no hypoglycemic complications. Symptoms are improving. Diabetic complications include peripheral neuropathy and PVD. (He underwent left BKA after complicated osteomyelitis on March 20, 2022.) Risk factors for coronary artery disease include dyslipidemia, diabetes mellitus, male sex, obesity, sedentary lifestyle and tobacco exposure. Current diabetic treatments: He is on Lantus 30 units nightly and metformin 1000 mg twice a day. His weight is decreasing steadily. He is following a generally unhealthy diet. When asked about meal planning, he reported none. He has not had a previous visit with a  dietitian. He never participates in exercise. His home blood glucose trend is decreasing steadily. His breakfast blood glucose range is generally 90-110 mg/dl. His bedtime blood glucose range is generally 90-110 mg/dl. His overall blood glucose range is 90-110 mg/dl. (This patient presents with continued significant improvement in his glycemic profile.  His point-of-care A1c is 5.7% improving from 10.4%.    He did not document any hypoglycemia.) An ACE inhibitor/angiotensin II receptor blocker is not being taken.  Hyperlipidemia This is a chronic problem. The current episode started more than 1 month ago. Exacerbating diseases include diabetes and obesity. Pertinent negatives include no chest pain, myalgias or shortness of breath. Current antihyperlipidemic treatment includes statins. Risk factors for coronary artery disease include diabetes mellitus, dyslipidemia, family history, obesity, male sex, hypertension and a sedentary lifestyle.     Review of Systems  Constitutional:  Negative for chills, fatigue, fever and unexpected weight change.  HENT:  Negative for dental problem, mouth sores and trouble swallowing.   Eyes:  Negative for visual disturbance.  Respiratory:  Negative for cough, choking, chest tightness, shortness of breath and wheezing.   Cardiovascular:  Negative for chest pain, palpitations and leg swelling.  Gastrointestinal:  Negative for abdominal distention, abdominal pain, constipation, diarrhea, nausea and vomiting.  Endocrine: Negative for polydipsia, polyphagia and polyuria.  Genitourinary:  Negative for dysuria, flank pain, hematuria and urgency.  Musculoskeletal:  Negative for back pain, gait problem, myalgias and neck pain.  Skin:  Negative for pallor, rash and wound.  Neurological:  Negative for seizures, syncope, weakness, numbness and headaches.  Psychiatric/Behavioral:  Negative for confusion and dysphoric mood.     Objective:       08/03/2022   10:57 AM 04/24/2022     2:34 PM 02/20/2022    3:09 PM  Vitals with BMI  Height 6\' 1"  6\' 1"  6\' 1"   Weight 231 lbs 13 oz  254 lbs 10 oz  BMI 123XX123  123XX123  Systolic 0000000 [id number] 96  Diastolic 94 82 64  Pulse 72 92 99    BP (!) 152/94   Pulse 72   Ht 6\' 1"  (1.854 m)   Wt 231 lb 12.8 oz (105.1 kg)   BMI 30.58 kg/m   Wt Readings from Last 3 Encounters:  08/03/22 231 lb 12.8 oz (105.1 kg)  02/20/22 254 lb 9.6 oz (115.5 kg)     Physical Exam Constitutional:      General: He is not in acute distress.    Appearance: He is well-developed.  HENT:     Head: Normocephalic and atraumatic.  Neck:     Thyroid: No thyromegaly.     Trachea: No tracheal deviation.  Cardiovascular:     Rate and Rhythm: Normal rate.     Pulses:          Dorsalis pedis pulses are 1+ on the right side and 1+ on the left side.       Posterior tibial pulses are 1+ on the right side and 1+ on the left side.     Heart sounds: Normal heart sounds, S1 normal and S2 normal. No murmur heard.    No gallop.  Pulmonary:     Effort: Pulmonary effort is normal. No respiratory distress.     Breath sounds: No wheezing.  Abdominal:     General: Bowel sounds are normal. There is no distension.     Palpations: Abdomen is soft.     Tenderness: There is no abdominal tenderness. There is no guarding.  Musculoskeletal:     Right shoulder: No swelling or deformity.     Cervical back: Normal range of motion and neck supple.     Comments: He wears a prosthetic leg for left BKA as a complication of osteomyelitis.  That Added 10 pounds of weight since last visit.    Skin:    General: Skin is warm and dry.     Findings: No rash.     Nails: There is no clubbing.  Neurological:     Mental Status: He is alert and oriented to person, place, and time.     Cranial Nerves: No cranial nerve deficit.     Sensory: No sensory deficit.     Gait: Gait normal.     Deep Tendon Reflexes: Reflexes are normal and symmetric.  Psychiatric:        Speech: Speech normal.         Behavior: Behavior normal. Behavior is cooperative.        Thought Content: Thought content normal.        Judgment: Judgment normal.       CMP ( most recent) CMP     Component Value Date/Time   NA 140 04/19/2022 0753   K 4.3 04/19/2022 0753   CL 101 04/19/2022 0753   CO2 26 04/19/2022 0753   GLUCOSE 95 04/19/2022 0753   GLUCOSE 101 (H) 01/26/2022 0400   BUN 14 04/19/2022 0753   CREATININE 0.82 04/19/2022 0753   CALCIUM 9.4 04/19/2022 0753   PROT 6.9 04/19/2022 0753   ALBUMIN 4.3 04/19/2022 0753   AST 22 04/19/2022 0753   ALT 27 04/19/2022 0753   ALKPHOS 63 04/19/2022 0753   BILITOT 0.4 04/19/2022 0753   GFRNONAA >60 01/26/2022 0400     Diabetic Labs (most recent): Lab Results  Component Value Date   HGBA1C 5.7 08/03/2022   HGBA1C 10.4 (H) 01/19/2022   HGBA1C 11.4 (H) 01/17/2022     Assessment & Plan:   1. Poorly controlled type 2 diabetes mellitus (HCC)   - Dawaun Brancato has currently uncontrolled symptomatic type 2 DM since  46 years of age. This patient presents with continued significant improvement in his glycemic profile with point-of-care A1c of 5.7%, improving from 10.4% during his first visit.  He has got prosthetic leg on left BKA adding 10 pounds of weight since last visit.    His recent labs are reviewed. He was diagnosed with diabetes with A1c of 11.4% January 2023.  Admittedly, he has not seen primary care doctor in several years prior to that. - I had a long discussion with him about the progressive nature of diabetes and the pathology behind its  complications. -his diabetes is complicated by diabetic foot ulcer complicated by osteomyelitis requiring debridement as well as ongoing antibiotic treatment and he remains at a high risk for more acute and chronic complications which include CAD, CVA, CKD, retinopathy, and neuropathy. These are all discussed in detail with him.  - I discussed all available options of managing his diabetes  including de-escalation of medications. I have counseled him on diet  and weight management  by adopting a Whole Food , Plant Predominant  ( WFPP) nutrition as recommended by [employer] of Lifestyle Medicine. Patient is encouraged to switch to  unprocessed or minimally processed  complex starch, adequate protein intake (mainly plant source), minimal liquid fat ( mainly vegetable oils), plenty of fruits, and vegetables. -  he is advised to stick to a routine mealtimes to eat 3 complete meals a day and snack only when necessary ( to snack only to correct hypoglycemia BG <70 day time or <100 at night).   -He is engaged in lifestyle medicine and benefiting tremendously. - he acknowledges that there is a room for improvement in his food and drink choices. - Suggestion is made for him to avoid simple carbohydrates  from his diet including Cakes, Sweet Desserts, Ice Cream, Soda (diet and regular), Sweet Tea, Candies, Chips, Cookies, Store Bought Juices, Alcohol , Artificial Sweeteners,  Coffee Creamer, and "Sugar-free" Products, Lemonade. This will help patient to have more stable blood glucose profile and potentially avoid unintended weight gain.  The following Lifestyle Medicine recommendations according to [place]  [facility]) were discussed and and offered to patient and he  agrees to start the journey:  A. Whole Foods, Plant-Based Nutrition comprising of fruits and vegetables, plant-based proteins, whole-grain carbohydrates was discussed in detail with the patient.   A list for source of those nutrients were also provided to the patient.  Patient will use only water or unsweetened tea for hydration. B.  The need to stay away from risky substances including alcohol, smoking; obtaining 7 to 9 hours of restorative sleep, at least 150 minutes of moderate intensity exercise weekly, the importance of healthy social connections,  and stress management techniques were discussed. C.   A full color page of  Calorie density of various food groups per pound showing examples of each food groups was provided to the patient.    - I have approached him with the following plan to manage  his diabetes and patient agrees:   -In light of his presentation with tightly controlled glycemic profile, he was taken off of insulin during last visit.  During this visit, he is advised to lower his metformin to 500 mg p.o. daily only at breakfast.    He has a good chance of reversing his diabetes.    - Specific targets for  A1c;  LDL, HDL,  and Triglycerides were discussed with the patient.  2) Blood Pressure /Hypertension:  his blood pressure is not  controlled to target.  He will be considered for low-dose antihypertensive medication during his next visit if his blood pressure remains above 130/80 mmHg.     3) Lipids/Hyperlipidemia: His previsit labs show favorable lipid panel including LDL of 39.  He did not have previous lipid panel to review.  He is advised to continue atorvastatin 20 mg p.o. nightly.  He will be considered for fasting lipid panel before his next visit.    The detailed WF PB diet above will help with dyslipidemia as well.  4)  Weight/Diet:  Body mass index is 30.58 kg/m.  -   clearly complicating his diabetes care.   he is  a candidate for weight loss. I discussed with him the fact that loss of 5 - 10% of his  current body weight will have the most impact on his diabetes management.  The above detailed  ACLM recommendations for nutrition, exercise, sleep, social life, avoidance of risky substances, the need for restorative sleep   information will also detailed on discharge instructions.  5) hypothyroidism:  -His previsit labs are consistent with inadequate replacement.  I discussed and increase his levothyroxine to 50 mcg p.o. daily before breakfast.   - We discussed about the correct intake of his thyroid hormone, on empty stomach at fasting, with water, separated by at  least 30 minutes from breakfast and other medications,  and separated by more than 4 hours from calcium, iron, multivitamins, acid reflux medications (PPIs). -Patient is made aware of the fact that thyroid hormone replacement is needed for life, dose to be adjusted by periodic monitoring of thyroid function tests.    6) Chronic Care/Health Maintenance:  -he  is Statin medications and  is encouraged to initiate and continue to follow up with Ophthalmology, Dentist,  Podiatrist at least yearly or according to recommendations, and advised to   stay away from smoking. I have recommended yearly flu vaccine and pneumonia vaccine at least every 5 years; moderate intensity exercise for up to 150 minutes weekly; and  sleep for 7- 9 hours a day.  - he is  advised to maintain close follow up with Kotturi, Tyler Deis, MD for primary care needs, as well as his other providers for optimal and coordinated care.   I spent 41 minutes in the care of the patient today including review of labs from [place], Lipids, Thyroid Function, Hematology (current and previous including abstractions from other facilities); face-to-face time discussing  his blood glucose readings/logs, discussing hypoglycemia and hyperglycemia episodes and symptoms, medications doses, his options of short and long term treatment based on the latest standards of care / guidelines;  discussion about incorporating lifestyle medicine;  and documenting the encounter. Risk reduction counseling performed per USPSTF guidelines to reduce obesity and cardiovascular risk factors.     Please refer to Patient Instructions for Blood Glucose Monitoring and Insulin/Medications Dosing Guide"  in media tab for additional information. Please  also refer to " Patient Self Inventory" in the Media  tab for reviewed elements of pertinent patient history.  Debera Lat participated in the discussions, expressed understanding, and voiced agreement with the above plans.  All  questions were answered to his satisfaction. he is encouraged to contact clinic should he have any questions or concerns prior to his return visit.    Follow up plan: - Return in about 3 months (around 11/03/2022) for F/U with Pre-visit Labs, Meter/CGM/Logs, A1c here.  Glade Lloyd, MD [facility] [address] [place], [place] [address] Phone: [phone]  Fax: [phone]    08/03/2022, 2:45 PM  This note was partially dictated with voice recognition software. Similar sounding words can be transcribed inadequately or may not  be corrected upon review.

## 2022-11-02 DIAGNOSIS — E1165 Type 2 diabetes mellitus with hyperglycemia: Secondary | ICD-10-CM | POA: Diagnosis not present

## 2022-11-03 LAB — COMPREHENSIVE METABOLIC PANEL
ALT: 24 IU/L (ref 0–44)
AST: 17 IU/L (ref 0–40)
Albumin/Globulin Ratio: 1.2 (ref 1.2–2.2)
Albumin: 3.8 g/dL — ABNORMAL LOW (ref 4.1–5.1)
Alkaline Phosphatase: 85 IU/L (ref 44–121)
BUN/Creatinine Ratio: 11 (ref 9–20)
BUN: 9 mg/dL (ref 6–24)
Bilirubin Total: 0.4 mg/dL (ref 0.0–1.2)
CO2: 27 mmol/L (ref 20–29)
Calcium: 9.4 mg/dL (ref 8.7–10.2)
Chloride: 99 mmol/L (ref 96–106)
Creatinine, Ser: 0.79 mg/dL (ref 0.76–1.27)
Globulin, Total: 3.3 g/dL (ref 1.5–4.5)
Glucose: 99 mg/dL (ref 70–99)
Potassium: 4.1 mmol/L (ref 3.5–5.2)
Sodium: 141 mmol/L (ref 134–144)
Total Protein: 7.1 g/dL (ref 6.0–8.5)
eGFR: 111 mL/min/{1.73_m2} (ref >59)

## 2022-11-03 LAB — LIPID PANEL
Chol/HDL Ratio: 2.5 ratio (ref 0.0–5.0)
Cholesterol, Total: 123 mg/dL (ref 100–199)
HDL: 49 mg/dL (ref >39)
LDL Chol Calc (NIH): 58 mg/dL (ref 0–99)
Triglycerides: 81 mg/dL (ref 0–149)
VLDL Cholesterol Cal: 16 mg/dL (ref 5–40)

## 2022-11-03 LAB — VITAMIN B12: Vitamin B-12: 363 pg/mL (ref 232–1245)

## 2022-11-03 LAB — T4, FREE: Free T4: 1.04 ng/dL (ref 0.82–1.77)

## 2022-11-03 LAB — VITAMIN D 25 HYDROXY (VIT D DEFICIENCY, FRACTURES): Vit D, 25-Hydroxy: 23.5 ng/mL — ABNORMAL LOW (ref 30.0–100.0)

## 2022-11-03 LAB — TSH: TSH: 4.76 u[IU]/mL — ABNORMAL HIGH (ref 0.450–4.500)

## 2022-11-09 ENCOUNTER — Encounter: Payer: Self-pay | Admitting: "Endocrinology

## 2022-11-09 ENCOUNTER — Ambulatory Visit: Payer: BC Managed Care – PPO | Admitting: "Endocrinology

## 2022-11-09 VITALS — BP 156/88 | HR 52 | Ht 73.0 in | Wt 234.6 lb

## 2022-11-09 DIAGNOSIS — Z6833 Body mass index (BMI) 33.0-33.9, adult: Secondary | ICD-10-CM

## 2022-11-09 DIAGNOSIS — E782 Mixed hyperlipidemia: Secondary | ICD-10-CM

## 2022-11-09 DIAGNOSIS — E1165 Type 2 diabetes mellitus with hyperglycemia: Secondary | ICD-10-CM | POA: Diagnosis not present

## 2022-11-09 DIAGNOSIS — E6609 Other obesity due to excess calories: Secondary | ICD-10-CM

## 2022-11-09 DIAGNOSIS — E039 Hypothyroidism, unspecified: Secondary | ICD-10-CM

## 2022-11-09 LAB — POCT GLYCOSYLATED HEMOGLOBIN (HGB A1C): HbA1c, POC (controlled diabetic range): 6 % (ref 0.0–7.0)

## 2022-11-09 MED ORDER — LEVOTHYROXINE SODIUM 75 MCG PO TABS
75.0000 ug | ORAL_TABLET | Freq: Every day | ORAL | 1 refills | Status: DC
Start: 2022-11-09 — End: 2023-05-10

## 2022-11-09 MED ORDER — DIALYVITE VITAMIN D 5000 125 MCG (5000 UT) PO CAPS: 5000.0000 [IU] | ORAL_CAPSULE | Freq: Every day | ORAL | 1 refills | Status: DC

## 2022-11-09 NOTE — Progress Notes (Signed)
11/09/2022, 2:10 PM  Endocrinology follow-up note   Subjective:    Patient ID: Dakota Matthews, male    DOB: [birth year].  Dakota Matthews is being seen in follow-up after he was seen in consultation for management of currently uncontrolled symptomatic diabetes requested by  Beatrix Fetters, MD.   Past Medical History:  Diagnosis Date   Diabetes [facility])     Past Surgical History:  Procedure Laterality Date   BELOW KNEE LEG AMPUTATION Left 03/2022    Social History   Socioeconomic History   Marital status: Divorced    Spouse name: Not on file   Number of children: Not on file   Years of education: Not on file   Highest education level: Not on file  Occupational History   Not on file  Tobacco Use   Smoking status: Former    Types: Cigarettes    Quit date: 04/23/2021    Years since quitting: 1.5   Smokeless tobacco: Current  Vaping Use   Vaping Use: Never used  Substance and Sexual Activity   Alcohol use: Yes    Alcohol/week: 1.0 standard drink of alcohol    Types: 1 Cans of beer per week    Comment: Occ, stopped in May   Drug use: Never   Sexual activity: Not on file  Other Topics Concern   Not on file  Social History Narrative   Not on file   Social Determinants of Health   Financial Resource Strain: Not on file  Food Insecurity: Not on file  Transportation Needs: Not on file  Physical Activity: Not on file  Stress: Not on file  Social Connections: Not on file    Family History  Problem Relation Age of Onset   Cancer Father     Outpatient Encounter Medications as of 11/09/2022  Medication Sig   [DISCONTINUED] Cholecalciferol (DIALYVITE VITAMIN D 5000 PO) Take 5,000 Units by mouth daily with supper.   atorvastatin (LIPITOR) 20 MG tablet Take 20 mg by mouth daily.   BD INSULIN SYRINGE U/F 30G X 1/2" 0.5 ML MISC USE ONCE DAILY WITH INSULIN   ceFEPIme 2 g in sodium chloride 0.9 % 100 mL  Inject 2 g into the vein every 12 (twelve) hours.   Cholecalciferol (DIALYVITE VITAMIN D 5000) 125 MCG (5000 UT) capsule Take 1 capsule (5,000 Units total) by mouth daily with supper.   Docusate Sodium (DSS) 100 MG CAPS Take by mouth.   HYDROcodone-acetaminophen (NORCO/VICODIN) 5-325 MG tablet Take 1 tablet by mouth every 6 (six) hours as needed.   Insulin Pen Needle (PEN NEEDLES) 31G X 8 MM MISC Use to inject insulin daily at bedtime   levothyroxine (SYNTHROID) 75 MCG tablet Take 1 tablet (75 mcg total) by mouth daily before breakfast.   metFORMIN (GLUCOPHAGE) 500 MG tablet Take 500 mg by mouth daily after breakfast.   vitamin C (ASCORBIC ACID) 500 MG tablet Take 500 mg by mouth daily.   [DISCONTINUED] levothyroxine (SYNTHROID) 50 MCG tablet Take 1 tablet (50 mcg total) by mouth daily before breakfast.   No facility-administered encounter medications on file as of 11/09/2022.    ALLERGIES: No Known Allergies  VACCINATION STATUS:  There  is no immunization history on file for this patient.  Diabetes He presents for his follow-up diabetic visit. He has type 2 diabetes mellitus. Onset time: He was diagnosed in January 2023 at age of 56. 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 There are no hypoglycemic associated symptoms. Pertinent negatives for hypoglycemia include no confusion, headaches, pallor or seizures. Pertinent negatives for diabetes include no chest pain, no fatigue, no polydipsia, no polyphagia, no polyuria and no weakness. There are no hypoglycemic complications. Symptoms are stable. Diabetic complications include peripheral neuropathy and PVD. (He underwent left BKA after complicated osteomyelitis on March 20, 2022.) Risk factors for coronary artery disease include dyslipidemia, diabetes mellitus, male sex, obesity, sedentary lifestyle and tobacco exposure. Current diabetic treatments: He  is on Lantus 30 units nightly and metformin 1000 mg twice a day. His weight is fluctuating minimally. He is following a generally unhealthy diet. When asked about meal planning, he reported none. He has not had a previous visit with a dietitian. He never participates in exercise. His home blood glucose trend is decreasing steadily. His breakfast blood glucose range is generally 90-110 mg/dl. His bedtime blood glucose range is generally 90-110 mg/dl. His overall blood glucose range is 90-110 mg/dl. (This patient presents with continued control of diabetes with point-of-care A1c of 6%, overall improving from 10.4%.  He did not document any hypoglycemia. ) An ACE inhibitor/angiotensin II receptor blocker is not being taken.  Hyperlipidemia This is a chronic problem. The current episode started more than 1 month ago. Exacerbating diseases include diabetes and obesity. Pertinent negatives include no chest pain, myalgias or shortness of breath. Current antihyperlipidemic treatment includes statins. Risk factors for coronary artery disease include diabetes mellitus, dyslipidemia, family history, obesity, male sex, hypertension and a sedentary lifestyle.     Review of Systems  Constitutional:  Negative for chills, fatigue, fever and unexpected weight change.  HENT:  Negative for dental problem, mouth sores and trouble swallowing.   Eyes:  Negative for visual disturbance.  Respiratory:  Negative for cough, choking, chest tightness, shortness of breath and wheezing.   Cardiovascular:  Negative for chest pain, palpitations and leg swelling.  Gastrointestinal:  Negative for abdominal distention, abdominal pain, constipation, diarrhea, nausea and vomiting.  Endocrine: Negative for polydipsia, polyphagia and polyuria.  Genitourinary:  Negative for dysuria, flank pain, hematuria and urgency.  Musculoskeletal:  Negative for back pain, gait problem, myalgias and neck pain.  Skin:  Negative for pallor, rash and  wound.  Neurological:  Negative for seizures, syncope, weakness, numbness and headaches.  Psychiatric/Behavioral:  Negative for confusion and dysphoric mood.     Objective:       11/09/2022   11:27 AM 08/03/2022   10:57 AM 04/24/2022    2:34 PM  Vitals with BMI  Height 6\' 1"  6\' 1"  6\' 1"   Weight 234 lbs 10 oz 231 lbs 13 oz   BMI 30.96 30.59   Systolic 156 152  Diastolic 88 94 82  Pulse 52 72 92    BP (!) 156/88   Pulse (!) 52   Ht 6\' 1"  (1.854 m)   Wt 234 lb 9.6 oz (106.4 kg)   BMI 30.95 kg/m   Wt Readings from Last 3 Encounters:  11/09/22 234 lb 9.6 oz (106.4 kg)  08/03/22 231 lb 12.8 oz (105.1 kg)  02/20/22 254 lb 9.6 oz (115.5 kg)     Physical Exam Constitutional:  General: He is not in acute distress.    Appearance: He is well-developed.  HENT:     Head: Normocephalic and atraumatic.  Neck:     Thyroid: No thyromegaly.     Trachea: No tracheal deviation.  Cardiovascular:     Rate and Rhythm: Normal rate.     Pulses:          Dorsalis pedis pulses are 1+ on the right side and 1+ on the left side.       Posterior tibial pulses are 1+ on the right side and 1+ on the left side.     Heart sounds: Normal heart sounds, S1 normal and S2 normal. No murmur heard.    No gallop.  Pulmonary:     Effort: Pulmonary effort is normal. No respiratory distress.     Breath sounds: No wheezing.  Abdominal:     General: Bowel sounds are normal. There is no distension.     Palpations: Abdomen is soft.     Tenderness: There is no abdominal tenderness. There is no guarding.  Musculoskeletal:     Right shoulder: No swelling or deformity.     Cervical back: Normal range of motion and neck supple.     Comments: He wears a prosthetic leg for left BKA as a complication of osteomyelitis.  That Added 10 pounds of weight since last visit.    Skin:    General: Skin is warm and dry.     Findings: No rash.     Nails: There is no clubbing.  Neurological:     Mental Status: He is  alert and oriented to person, place, and time.     Cranial Nerves: No cranial nerve deficit.     Sensory: No sensory deficit.     Gait: Gait normal.     Deep Tendon Reflexes: Reflexes are normal and symmetric.  Psychiatric:        Speech: Speech normal.        Behavior: Behavior normal. Behavior is cooperative.        Thought Content: Thought content normal.        Judgment: Judgment normal.       CMP ( most recent) CMP     Component Value Date/Time   NA 141 11/02/2022 1033   K 4.1 11/02/2022 1033   CL 99 11/02/2022 1033   CO2 27 11/02/2022 1033   GLUCOSE 99 11/02/2022 1033   GLUCOSE 101 (H) 01/26/2022 0400   BUN 9 11/02/2022 1033   CREATININE 0.79 11/02/2022 1033   CALCIUM 9.4 11/02/2022 1033   PROT 7.1 11/02/2022 1033   ALBUMIN 3.8 (L) 11/02/2022 1033   AST 17 11/02/2022 1033   ALT 24 11/02/2022 1033   ALKPHOS 85 11/02/2022 1033   BILITOT 0.4 11/02/2022 1033   GFRNONAA >60 01/26/2022 0400     Diabetic Labs (most recent): Lab Results  Component Value Date   HGBA1C 6.0 11/09/2022   HGBA1C 5.7 08/03/2022   HGBA1C 10.4 (H) 01/19/2022   Lipid Panel     Component Value Date/Time   CHOL 123 11/02/2022 1033   TRIG 81 11/02/2022 1033   HDL 49 11/02/2022 1033   CHOLHDL 2.5 11/02/2022 1033   LDLCALC 58 11/02/2022 1033   LABVLDL 16 11/02/2022 1033     Assessment & Plan:   1. Poorly controlled type 2 diabetes mellitus (HCC)   - Marshell GarfinkelJames Pascal has currently uncontrolled symptomatic type 2 DM since  46 years of age.  Fayrene FearingJames continues to  have it from lifestyle medicine.  This patient presents with continued control of diabetes with point-of-care A1c of 6%, overall improving from 10.4%.  He did not document any hypoglycemia.  His recent labs are reviewed. He was diagnosed with diabetes with A1c of 11.4% January 2023.  Admittedly, he has not seen primary care doctor in several years prior to that. - I had a long discussion with him about the progressive nature of  diabetes and the pathology behind its complications. -his diabetes is complicated by diabetic foot ulcer complicated by osteomyelitis requiring debridement as well as ongoing antibiotic treatment and he remains at a high risk for more acute and chronic complications which include CAD, CVA, CKD, retinopathy, and neuropathy. These are all discussed in detail with him.  - I discussed all available options of managing his diabetes including de-escalation of medications. I have counseled him on diet  and weight management  by adopting a Whole Food , Plant Predominant  ( WFPP) nutrition as recommended by [employer] of Lifestyle Medicine. Patient is encouraged to switch to  unprocessed or minimally processed  complex starch, adequate protein intake (mainly plant source), minimal liquid fat ( mainly vegetable oils), plenty of fruits, and vegetables. -  he is advised to stick to a routine mealtimes to eat 3 complete meals a day and snack only when necessary ( to snack only to correct hypoglycemia BG <70 day time or <100 at night).   -He is engaged in lifestyle medicine and benefiting tremendously. - he acknowledges that there is a room for improvement in his food and drink choices. - Suggestion is made for him to avoid simple carbohydrates  from his diet including Cakes, Sweet Desserts, Ice Cream, Soda (diet and regular), Sweet Tea, Candies, Chips, Cookies, Store Bought Juices, Alcohol , Artificial Sweeteners,  Coffee Creamer, and "Sugar-free" Products, Lemonade. This will help patient to have more stable blood glucose profile and potentially avoid unintended weight gain.  The following Lifestyle Medicine recommendations according to American College of Lifestyle Medicine  [facility]) were discussed and and offered to patient and he  agrees to start the journey:  A. Whole Foods, Plant-Based Nutrition comprising of fruits and vegetables, plant-based proteins, whole-grain carbohydrates was discussed in detail with  the patient.   A list for source of those nutrients were also provided to the patient.  Patient will use only water or unsweetened tea for hydration. B.  The need to stay away from risky substances including alcohol, smoking; obtaining 7 to 9 hours of restorative sleep, at least 150 minutes of moderate intensity exercise weekly, the importance of healthy social connections,  and stress management techniques were discussed. C.  A full color page of  Calorie density of various food groups per pound showing examples of each food groups was provided to the patient.    - I have approached him with the following plan to manage  his diabetes and patient agrees:   -In light of his presentation with tightly controlled glycemic profile, he will not need any additional intervention with medication.  He is advised to continue metformin 500 mg p.o. daily at breakfast.  He has a good chance of reversing his diabetes.    - Specific targets for  A1c;  LDL, HDL,  and Triglycerides were discussed with the patient.  2) Blood Pressure /Hypertension: His blood pressure is not controlled to target levels.  He will be considered for low-dose antihypertensive medication during his next visit if his blood pressure remains  above 130/80 mmHg.  He is educated on salt restrictions.   3) Lipids/Hyperlipidemia: His previsit labs show favorable lipid panel including LDL of 58.   He is advised to continue atorvastatin 20 mg p.o. nightly.    He will be considered for fasting lipid panel before his next visit.    The detailed WF PB diet above will help with dyslipidemia as well.  4)  Weight/Diet:  Body mass index is 30.95 kg/m.  -   clearly complicating his diabetes care.   he is  a candidate for weight loss. I discussed with him the fact that loss of 5 - 10% of his  current body weight will have the most impact on his diabetes management.  The above detailed  ACLM recommendations for nutrition, exercise, sleep, social life,  avoidance of risky substances, the need for restorative sleep   information will also detailed on discharge instructions.  5) hypothyroidism:  -His previsit labs are consistent with inadequate replacement.  I discussed and increased his levothyroxine to 75 mcg p.o. daily before breakfast.   - We discussed about the correct intake of his thyroid hormone, on empty stomach at fasting, with water, separated by at least 30 minutes from breakfast and other medications,  and separated by more than 4 hours from calcium, iron, multivitamins, acid reflux medications (PPIs). -Patient is made aware of the fact that thyroid hormone replacement is needed for life, dose to be adjusted by periodic monitoring of thyroid function tests.   6) Chronic Care/Health Maintenance:  -he  is Statin medications and  is encouraged to initiate and continue to follow up with Ophthalmology, Dentist,  Podiatrist at least yearly or according to recommendations, and advised to   stay away from smoking. I have recommended yearly flu vaccine and pneumonia vaccine at least every 5 years; moderate intensity exercise for up to 150 minutes weekly; and  sleep for 7- 9 hours a day.  - he is  advised to maintain close follow up with Kotturi, Zadie Rhine, MD for primary care needs, as well as his other providers for optimal and coordinated care.   I spent 32 minutes in the care of the patient today including review of labs from CMP, Lipids, Thyroid Function, Hematology (current and previous including abstractions from other facilities); face-to-face time discussing  his blood glucose readings/logs, discussing hypoglycemia and hyperglycemia episodes and symptoms, medications doses, his options of short and long term treatment based on the latest standards of care / guidelines;  discussion about incorporating lifestyle medicine;  and documenting the encounter. Risk reduction counseling performed per USPSTF guidelines to reduce  obesity and  cardiovascular risk factors.     Please refer to Patient Instructions for Blood Glucose Monitoring and Insulin/Medications Dosing Guide"  in media tab for additional information. Please  also refer to " Patient Self Inventory" in the Media  tab for reviewed elements of pertinent patient history.  Marshell Garfinkel participated in the discussions, expressed understanding, and voiced agreement with the above plans.  All questions were answered to his satisfaction. he is encouraged to contact clinic should he have any questions or concerns prior to his return visit.   Follow up plan: - Return in about 6 months (around 05/10/2023) for F/U with Pre-visit Labs, A1c -NV.  Marquis Lunch, MD [facility] [address] [place], [place] [address] Phone: [phone]  Fax: [phone]    11/09/2022, 2:10 PM  This note was partially dictated with voice recognition software.  Similar sounding words can be transcribed inadequately or may not  be corrected upon review.

## 2022-11-09 NOTE — Patient Instructions (Signed)
                                     Advice for Weight Management  -For most of us the best way to lose weight is by diet management. Generally speaking, diet management means consuming less calories intentionally which over time brings about progressive weight loss.  This can be achieved more effectively by avoiding ultra processed carbohydrates, processed meats, unhealthy fats.    It is critically important to know your numbers: how much calorie you are consuming and how much calorie you need. More importantly, our carbohydrates sources should be unprocessed naturally occurring  complex starch food items.  It is always important to balance nutrition also by  appropriate intake of proteins (mainly plant-based), healthy fats/oils, plenty of fruits and vegetables.   -The American College of Lifestyle Medicine (ACL M) recommends nutrition derived mostly from Whole Food, Plant Predominant Sources example an apple instead of applesauce or apple pie. Eat Plenty of vegetables, Mushrooms, fruits, Legumes, Whole Grains, Nuts, seeds in lieu of processed meats, processed snacks/pastries red meat, poultry, eggs.  Use only water or unsweetened tea for hydration.  The College also recommends the need to stay away from risky substances including alcohol, smoking; obtaining 7-9 hours of restorative sleep, at least 150 minutes of moderate intensity exercise weekly, importance of healthy social connections, and being mindful of stress and seek help when it is overwhelming.    -Sticking to a routine mealtime to eat 3 meals a day and avoiding unnecessary snacks is shown to have a big role in weight control. Under normal circumstances, the only time we burn stored energy is when we are hungry, so allow  some hunger to take place- hunger means no food between appropriate meal times, only water.  It is not advisable to starve.   -It is better to avoid simple carbohydrates including:  Cakes, Sweet Desserts, Ice Cream, Soda (diet and regular), Sweet Tea, Candies, Chips, Cookies, Store Bought Juices, Alcohol in Excess of  1-2 drinks a day, Lemonade,  Artificial Sweeteners, Doughnuts, Coffee Creamers, "Sugar-free" Products, etc, etc.  This is not a complete list.....    -Consulting with certified diabetes educators is proven to provide you with the most accurate and current information on diet.  Also, you may be  interested in discussing diet options/exchanges , we can schedule a visit with Dakota Matthews, RDN, CDE for individualized nutrition education.  -Exercise: If you are able: 30 -60 minutes a day ,4 days a week, or 150 minutes of moderate intensity exercise weekly.    The longer the better if tolerated.  Combine stretch, strength, and aerobic activities.  If you were told in the past that you have high risk for cardiovascular diseases, or if you are currently symptomatic, you may seek evaluation by your heart doctor prior to initiating moderate to intense exercise programs.                                  Additional Care Considerations for Diabetes/Prediabetes   -Diabetes  is a chronic disease.  The most important care consideration is regular follow-up with your diabetes care provider with the goal being avoiding or delaying its complications and to take advantage of advances in medications and technology.  If appropriate actions are taken early enough, type 2 diabetes can even be   reversed.  Seek information from the right source.  - Whole Food, Plant Predominant Nutrition is highly recommended: Eat Plenty of vegetables, Mushrooms, fruits, Legumes, Whole Grains, Nuts, seeds in lieu of processed meats, processed snacks/pastries red meat, poultry, eggs as recommended by American College of  Lifestyle Medicine (ACLM).  -Type 2 diabetes is known to coexist with other important comorbidities such as high blood pressure and high cholesterol.  It is critical to control not only the  diabetes but also the high blood pressure and high cholesterol to minimize and delay the risk of complications including coronary artery disease, stroke, amputations, blindness, etc.  The good news is that this diet recommendation for type 2 diabetes is also very helpful for managing high cholesterol and high blood blood pressure.  - Studies showed that people with diabetes will benefit from a class of medications known as ACE inhibitors and statins.  Unless there are specific reasons not to be on these medications, the standard of care is to consider getting one from these groups of medications at an optimal doses.  These medications are generally considered safe and proven to help protect the heart and the kidneys.    - People with diabetes are encouraged to initiate and maintain regular follow-up with eye doctors, foot doctors, dentists , and if necessary heart and kidney doctors.     - It is highly recommended that people with diabetes quit smoking or stay away from smoking, and get yearly  flu vaccine and pneumonia vaccine at least every 5 years.  See above for additional recommendations on exercise, sleep, stress management , and healthy social connections.      

## 2023-01-21 ENCOUNTER — Other Ambulatory Visit: Payer: Self-pay | Admitting: "Endocrinology

## 2023-03-13 DIAGNOSIS — Z1330 Encounter for screening examination for mental health and behavioral disorders, unspecified: Secondary | ICD-10-CM | POA: Diagnosis not present

## 2023-03-13 DIAGNOSIS — T879 Unspecified complications of amputation stump: Secondary | ICD-10-CM | POA: Diagnosis not present

## 2023-03-14 DIAGNOSIS — M86562 Other chronic hematogenous osteomyelitis, left tibia and fibula: Secondary | ICD-10-CM | POA: Diagnosis not present

## 2023-03-14 DIAGNOSIS — T879 Unspecified complications of amputation stump: Secondary | ICD-10-CM | POA: Diagnosis not present

## 2023-03-18 DIAGNOSIS — Z89512 Acquired absence of left leg below knee: Secondary | ICD-10-CM | POA: Diagnosis not present

## 2023-03-18 DIAGNOSIS — T879 Unspecified complications of amputation stump: Secondary | ICD-10-CM | POA: Diagnosis not present

## 2023-03-22 DIAGNOSIS — T879 Unspecified complications of amputation stump: Secondary | ICD-10-CM | POA: Diagnosis not present

## 2023-03-22 DIAGNOSIS — E1142 Type 2 diabetes mellitus with diabetic polyneuropathy: Secondary | ICD-10-CM | POA: Diagnosis not present

## 2023-03-22 DIAGNOSIS — M8668 Other chronic osteomyelitis, other site: Secondary | ICD-10-CM | POA: Diagnosis not present

## 2023-03-22 DIAGNOSIS — M1990 Unspecified osteoarthritis, unspecified site: Secondary | ICD-10-CM | POA: Diagnosis not present

## 2023-03-22 DIAGNOSIS — Z89512 Acquired absence of left leg below knee: Secondary | ICD-10-CM | POA: Diagnosis not present

## 2023-03-22 DIAGNOSIS — T8789 Other complications of amputation stump: Secondary | ICD-10-CM | POA: Diagnosis not present

## 2023-03-22 DIAGNOSIS — I825Z9 Chronic embolism and thrombosis of unspecified deep veins of unspecified distal lower extremity: Secondary | ICD-10-CM | POA: Diagnosis not present

## 2023-03-22 DIAGNOSIS — Z87891 Personal history of nicotine dependence: Secondary | ICD-10-CM | POA: Diagnosis not present

## 2023-03-22 DIAGNOSIS — Z79899 Other long term (current) drug therapy: Secondary | ICD-10-CM | POA: Diagnosis not present

## 2023-03-22 DIAGNOSIS — Z79890 Hormone replacement therapy (postmenopausal): Secondary | ICD-10-CM | POA: Diagnosis not present

## 2023-03-22 DIAGNOSIS — B958 Unspecified staphylococcus as the cause of diseases classified elsewhere: Secondary | ICD-10-CM | POA: Diagnosis not present

## 2023-03-22 DIAGNOSIS — Z6830 Body mass index (BMI) 30.0-30.9, adult: Secondary | ICD-10-CM | POA: Diagnosis not present

## 2023-03-22 DIAGNOSIS — T8744 Infection of amputation stump, left lower extremity: Secondary | ICD-10-CM | POA: Diagnosis not present

## 2023-03-22 DIAGNOSIS — Z7984 Long term (current) use of oral hypoglycemic drugs: Secondary | ICD-10-CM | POA: Diagnosis not present

## 2023-03-22 DIAGNOSIS — E669 Obesity, unspecified: Secondary | ICD-10-CM | POA: Diagnosis not present

## 2023-03-22 DIAGNOSIS — Z481 Encounter for planned postprocedural wound closure: Secondary | ICD-10-CM | POA: Diagnosis not present

## 2023-03-22 DIAGNOSIS — E039 Hypothyroidism, unspecified: Secondary | ICD-10-CM | POA: Diagnosis not present

## 2023-03-22 DIAGNOSIS — Y835 Amputation of limb(s) as the cause of abnormal reaction of the patient, or of later complication, without mention of misadventure at the time of the procedure: Secondary | ICD-10-CM | POA: Diagnosis not present

## 2023-04-18 IMAGING — DX DG FOOT COMPLETE 3+V*L*
2 series · 3 of 3 positions shown · non-contrast
Comparison: 01/06/2022.  MRI foot 01/09/2022

CLINICAL DATA: Foot swelling.  Diabetic

EXAM:
LEFT FOOT - COMPLETE 3+ VIEW

[Series 1: foot · 0.14mm/px · 2 of 2 slices shown]
[im 1/2]
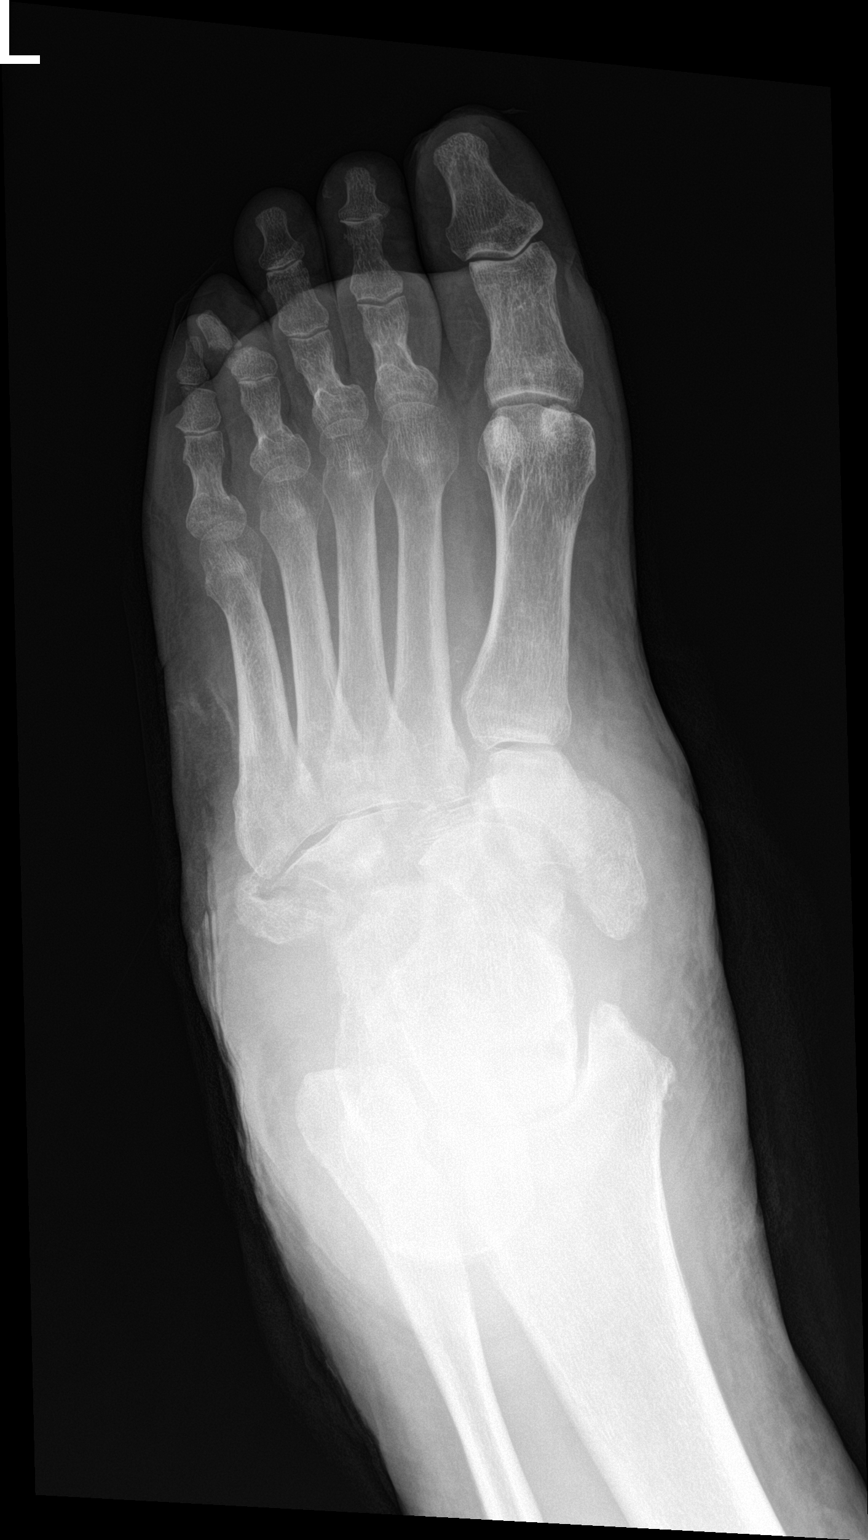
[im 2/2]
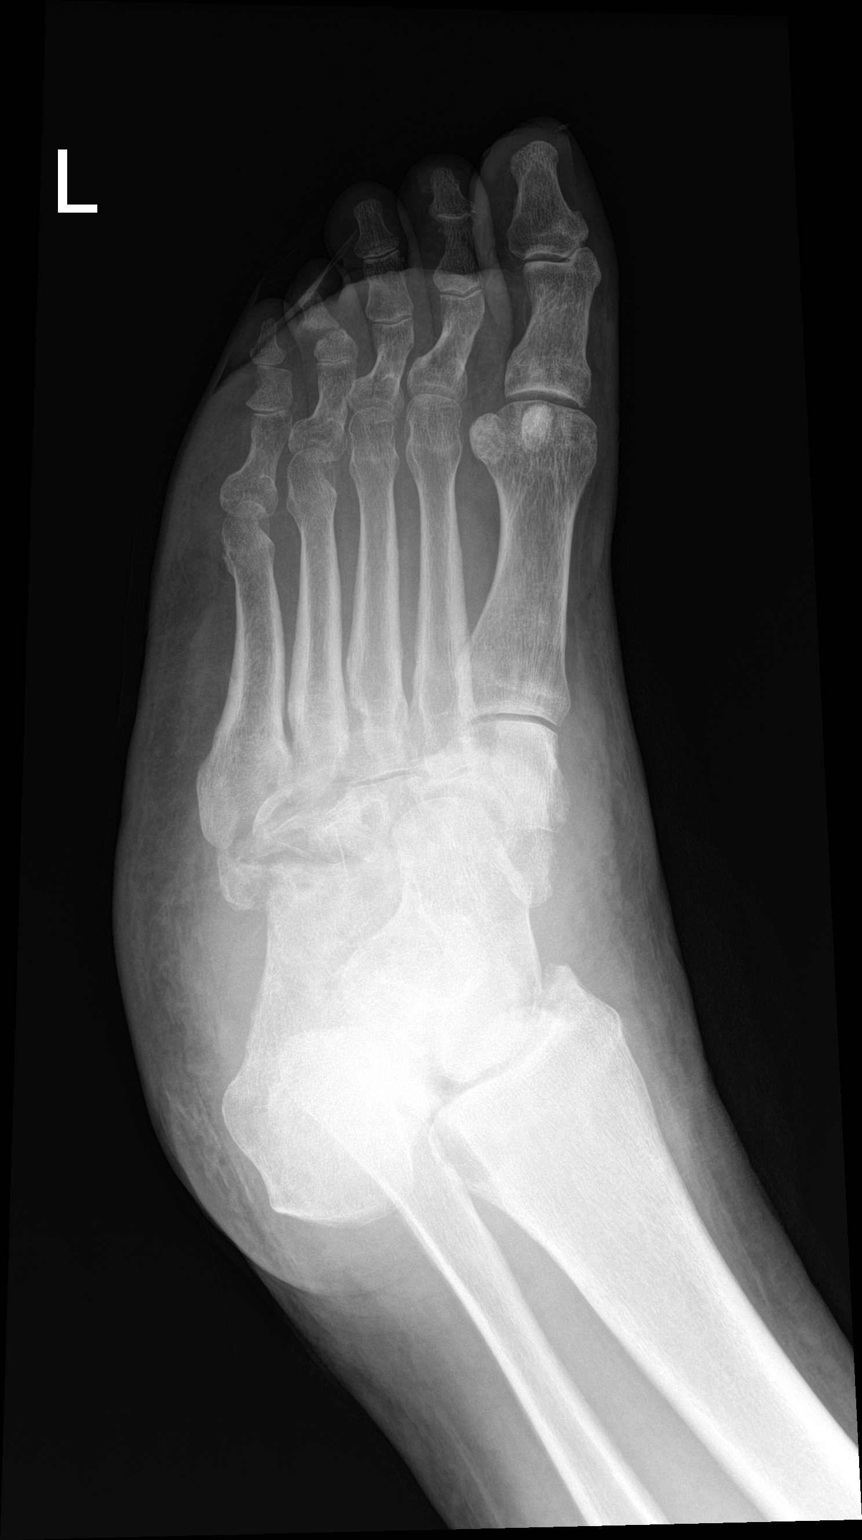

[leg]
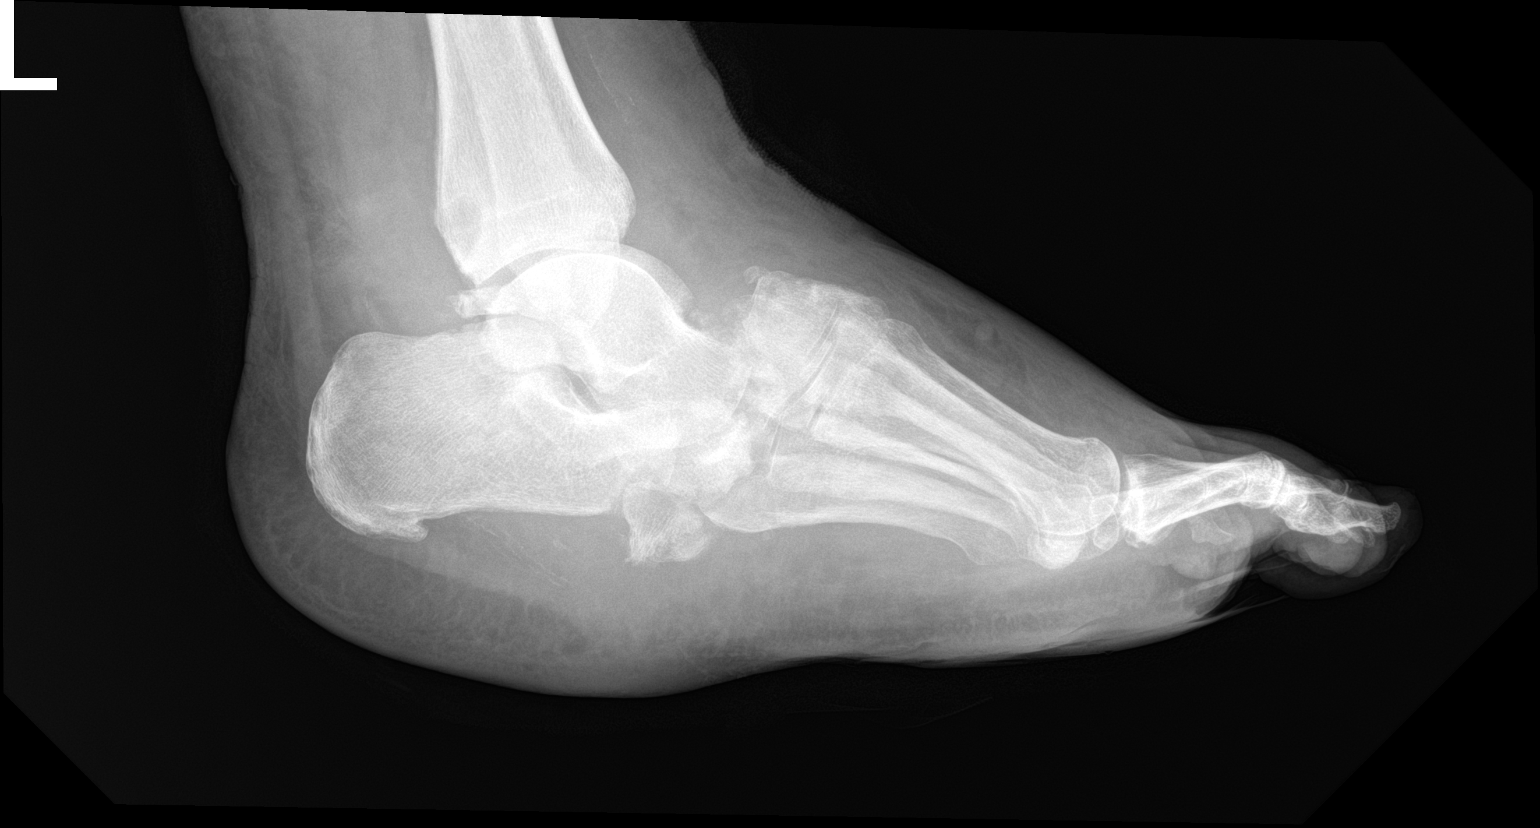

[3 of 3 positions shown; findings below may reference images not displayed]

FINDINGS: Dislocation at the talonavicular joint unchanged. There is
fragmentation of the midfoot. Findings compatible with neuropathic
joint. There is a bone fragment below the cuboid which appears to be
a fracture fragment also unchanged. There is mild widening between
the base of the first and second metatarsal.

There is widening of the fourth D IP joint with bony resorption
unchanged. This may be due to septic arthritis or chronic injury. No
significant soft tissue swelling of the fourth toe.

Diffuse soft tissue swelling in the midfoot. Joint effusion in the
ankle.
IMPRESSION: Neuropathic joint in the midfoot with dislocation of the
talonavicular joint. Bony fragmentation. Possible fracture of the
cuboid. No change from the prior study

Resorption of bone at the fourth PIP joint unchanged. This could be
due to chronic injury or osteomyelitis with septic arthritis.

## 2023-05-06 DIAGNOSIS — E1165 Type 2 diabetes mellitus with hyperglycemia: Secondary | ICD-10-CM | POA: Diagnosis not present

## 2023-05-06 DIAGNOSIS — E039 Hypothyroidism, unspecified: Secondary | ICD-10-CM | POA: Diagnosis not present

## 2023-05-07 LAB — COMPREHENSIVE METABOLIC PANEL
ALT: 29 IU/L (ref 0–44)
AST: 29 IU/L (ref 0–40)
Albumin/Globulin Ratio: 1.8 (ref 1.2–2.2)
Albumin: 4.6 g/dL (ref 4.1–5.1)
Alkaline Phosphatase: 62 IU/L (ref 44–121)
BUN/Creatinine Ratio: 13 (ref 9–20)
BUN: 13 mg/dL (ref 6–24)
Bilirubin Total: 0.6 mg/dL (ref 0.0–1.2)
CO2: 25 mmol/L (ref 20–29)
Calcium: 9.8 mg/dL (ref 8.7–10.2)
Chloride: 102 mmol/L (ref 96–106)
Creatinine, Ser: 0.97 mg/dL (ref 0.76–1.27)
Globulin, Total: 2.6 g/dL (ref 1.5–4.5)
Glucose: 106 mg/dL — ABNORMAL HIGH (ref 70–99)
Potassium: 5.3 mmol/L — ABNORMAL HIGH (ref 3.5–5.2)
Sodium: 143 mmol/L (ref 134–144)
Total Protein: 7.2 g/dL (ref 6.0–8.5)
eGFR: 97 mL/min/{1.73_m2} (ref >59)

## 2023-05-07 LAB — T4, FREE: Free T4: 1.24 ng/dL (ref 0.82–1.77)

## 2023-05-07 LAB — TSH: TSH: 5.82 u[IU]/mL — ABNORMAL HIGH (ref 0.450–4.500)

## 2023-05-10 ENCOUNTER — Ambulatory Visit (INDEPENDENT_AMBULATORY_CARE_PROVIDER_SITE_OTHER): Payer: BC Managed Care – PPO | Admitting: "Endocrinology

## 2023-05-10 ENCOUNTER — Encounter: Payer: Self-pay | Admitting: "Endocrinology

## 2023-05-10 VITALS — BP 136/82 | HR 92 | Ht 73.0 in | Wt 241.0 lb

## 2023-05-10 DIAGNOSIS — E039 Hypothyroidism, unspecified: Secondary | ICD-10-CM

## 2023-05-10 DIAGNOSIS — Z6833 Body mass index (BMI) 33.0-33.9, adult: Secondary | ICD-10-CM

## 2023-05-10 DIAGNOSIS — E782 Mixed hyperlipidemia: Secondary | ICD-10-CM | POA: Diagnosis not present

## 2023-05-10 DIAGNOSIS — E1165 Type 2 diabetes mellitus with hyperglycemia: Secondary | ICD-10-CM

## 2023-05-10 DIAGNOSIS — Z7984 Long term (current) use of oral hypoglycemic drugs: Secondary | ICD-10-CM

## 2023-05-10 DIAGNOSIS — E66811 Obesity, class 1: Secondary | ICD-10-CM

## 2023-05-10 DIAGNOSIS — E6609 Other obesity due to excess calories: Secondary | ICD-10-CM

## 2023-05-10 MED ORDER — LEVOTHYROXINE SODIUM 88 MCG PO TABS: 88.0000 ug | ORAL_TABLET | Freq: Every day | ORAL | 1 refills | Status: DC

## 2023-05-10 NOTE — Progress Notes (Signed)
05/10/2023, 1:54 PM  Endocrinology follow-up note   Subjective:    Patient ID: Dakota Matthews, male    DOB: 1976/11/19.  Dondrell Bayles is being seen in follow-up after he was seen in consultation for management of currently uncontrolled symptomatic diabetes requested by  Beatrix Fetters, MD.   Past Medical History:  Diagnosis Date   Diabetes [facility])     Past Surgical History:  Procedure Laterality Date   BELOW KNEE LEG AMPUTATION Left 03/2022    Social History   Socioeconomic History   Marital status: Divorced    Spouse name: Not on file   Number of children: Not on file   Years of education: Not on file   Highest education level: Not on file  Occupational History   Not on file  Tobacco Use   Smoking status: Former    Types: Cigarettes    Quit date: 04/23/2021    Years since quitting: 2.0   Smokeless tobacco: Current  Vaping Use   Vaping Use: Never used  Substance and Sexual Activity   Alcohol use: Yes    Alcohol/week: 1.0 standard drink of alcohol    Types: 1 Cans of beer per week    Comment: Occ, stopped in May   Drug use: Never   Sexual activity: Not on file  Other Topics Concern   Not on file  Social History Narrative   Not on file   Social Determinants of Health   Financial Resource Strain: Not on file  Food Insecurity: Not on file  Transportation Needs: Not on file  Physical Activity: Not on file  Stress: Not on file  Social Connections: Not on file    Family History  Problem Relation Age of Onset   Cancer Father     Outpatient Encounter Medications as of 05/10/2023  Medication Sig   atorvastatin (LIPITOR) 20 MG tablet Take 20 mg by mouth daily.   ceFEPIme 2 g in sodium chloride 0.9 % 100 mL Inject 2 g into the vein every 12 (twelve) hours.   Cholecalciferol (DIALYVITE VITAMIN D 5000) 125 MCG (5000 UT) capsule Take 1 capsule (5,000 Units total) by mouth daily with supper.    Docusate Sodium (DSS) 100 MG CAPS Take by mouth.   HYDROcodone-acetaminophen (NORCO/VICODIN) 5-325 MG tablet Take 1 tablet by mouth every 6 (six) hours as needed.   levothyroxine (SYNTHROID) 88 MCG tablet Take 1 tablet (88 mcg total) by mouth daily.   metFORMIN (GLUCOPHAGE) 500 MG tablet Take 500 mg by mouth daily after breakfast.   vitamin C (ASCORBIC ACID) 500 MG tablet Take 500 mg by mouth daily.   [DISCONTINUED] BD INSULIN SYRINGE U/F 30G X 1/2" 0.5 ML MISC USE ONCE DAILY WITH INSULIN   [DISCONTINUED] Insulin Pen Needle (PEN NEEDLES) 31G X 8 MM MISC Use to inject insulin daily at bedtime   [DISCONTINUED] levothyroxine (SYNTHROID) 75 MCG tablet Take 1 tablet (75 mcg total) by mouth daily before breakfast.   [DISCONTINUED] levothyroxine (SYNTHROID) 75 MCG tablet Take 1 tablet (75 mcg total) by mouth daily.   No facility-administered encounter medications on file as of 05/10/2023.    ALLERGIES: No Known Allergies  VACCINATION STATUS:  There  is no immunization history on file for this patient.  Diabetes He presents for his follow-up diabetic visit. He has type 2 diabetes mellitus. Onset time: He was diagnosed in January 2023 at age of 27. 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 There are no hypoglycemic associated symptoms. Pertinent negatives for hypoglycemia include no confusion, headaches, pallor or seizures. Pertinent negatives for diabetes include no chest pain, no fatigue, no polydipsia, no polyphagia, no polyuria and no weakness. There are no hypoglycemic complications. Symptoms are stable. Diabetic complications include peripheral neuropathy and PVD. (He underwent left BKA after complicated osteomyelitis on March 20, 2022.) Risk factors for coronary artery disease include dyslipidemia, diabetes mellitus, male sex, obesity, sedentary lifestyle and tobacco exposure. Current diabetic treatments:  He is on Lantus 30 units nightly and metformin 1000 mg twice a day. His weight is fluctuating minimally. He is following a generally unhealthy diet. When asked about meal planning, he reported none. He has not had a previous visit with a dietitian. He never participates in exercise. There is no change in his home blood glucose trend. (This patient presents with continued control in his diabetes with point-of-care A1c of 6.1% overall improving from 10.4%.   He is only on metformin 500 mg p.o. daily at breakfast.   He did not document any hypoglycemia. ) An ACE inhibitor/angiotensin II receptor blocker is not being taken.  Hyperlipidemia This is a chronic problem. The current episode started more than 1 month ago. Exacerbating diseases include diabetes and obesity. Pertinent negatives include no chest pain, myalgias or shortness of breath. Current antihyperlipidemic treatment includes statins. Risk factors for coronary artery disease include diabetes mellitus, dyslipidemia, family history, obesity, male sex, hypertension and a sedentary lifestyle.     Review of Systems  Constitutional:  Negative for chills, fatigue, fever and unexpected weight change.  HENT:  Negative for dental problem, mouth sores and trouble swallowing.   Eyes:  Negative for visual disturbance.  Respiratory:  Negative for cough, choking, chest tightness, shortness of breath and wheezing.   Cardiovascular:  Negative for chest pain, palpitations and leg swelling.  Gastrointestinal:  Negative for abdominal distention, abdominal pain, constipation, diarrhea, nausea and vomiting.  Endocrine: Negative for polydipsia, polyphagia and polyuria.  Genitourinary:  Negative for dysuria, flank pain, hematuria and urgency.  Musculoskeletal:  Negative for back pain, gait problem, myalgias and neck pain.  Skin:  Negative for pallor, rash and wound.  Neurological:  Negative for seizures, syncope, weakness, numbness and headaches.   Psychiatric/Behavioral:  Negative for confusion and dysphoric mood.     Objective:       05/10/2023   10:33 AM 11/09/2022   11:27 AM 08/03/2022   10:57 AM  Vitals with BMI  Height 6\' 1"  6\' 1"  6\' 1"   Weight 241 lbs 234 lbs 10 oz 231 lbs 13 oz  BMI 31.8 30.96 30.59  Systolic 136 156 161  Diastolic 82 88 94  Pulse 92 52 72    BP 136/82   Pulse 92   Ht 6\' 1"  (1.854 m)   Wt 241 lb (109.3 kg)   BMI 31.80 kg/m   Wt Readings from Last 3 Encounters:  05/10/23 241 lb (109.3 kg)  11/09/22 234 lb 9.6 oz (106.4 kg)  08/03/22 231 lb 12.8 oz (105.1 kg)      He wears a prosthetic leg for left BKA as a complication of osteomyelitis.   CMP ( most recent)  CMP     Component Value Date/Time   NA 143 05/06/2023 1233   K 5.3 (H) 05/06/2023 1233   CL 102 05/06/2023 1233   CO2 25 05/06/2023 1233   GLUCOSE 106 (H) 05/06/2023 1233   GLUCOSE 101 (H) 01/26/2022 0400   BUN 13 05/06/2023 1233   CREATININE 0.97 05/06/2023 1233   CALCIUM 9.8 05/06/2023 1233   PROT 7.2 05/06/2023 1233   ALBUMIN 4.6 05/06/2023 1233   AST 29 05/06/2023 1233   ALT 29 05/06/2023 1233   ALKPHOS 62 05/06/2023 1233   BILITOT 0.6 05/06/2023 1233   GFRNONAA >60 01/26/2022 0400     Diabetic Labs (most recent): Lab Results  Component Value Date   HGBA1C 6.0 11/09/2022   HGBA1C 5.7 08/03/2022   HGBA1C 10.4 (H) 01/19/2022   Lipid Panel     Component Value Date/Time   CHOL 123 11/02/2022 1033   TRIG 81 11/02/2022 1033   HDL 49 11/02/2022 1033   CHOLHDL 2.5 11/02/2022 1033   LDLCALC 58 11/02/2022 1033   LABVLDL 16 11/02/2022 1033     Assessment & Plan:   1. Poorly controlled type 2 diabetes mellitus (HCC)   - Quindell Reuther has currently uncontrolled symptomatic type 2 DM since  47 years of age.  This patient presents with continued control in his diabetes with point-of-care A1c of 6.1% overall improving from 10.4%.   He is only on metformin 500 mg p.o. daily at breakfast.   He did not document any  hypoglycemia.  His recent labs are reviewed. He was diagnosed with diabetes with A1c of 11.4% January 2023.  Admittedly, he has not seen primary care doctor in several years prior to that. - I had a long discussion with him about the progressive nature of diabetes and the pathology behind its complications. -his diabetes is complicated by diabetic foot ulcer complicated by osteomyelitis requiring debridement as well as ongoing antibiotic treatment and he remains at a high risk for more acute and chronic complications which include CAD, CVA, CKD, retinopathy, and neuropathy. These are all discussed in detail with him.  - I discussed all available options of managing his diabetes including de-escalation of medications. I have counseled him on diet  and weight management  by adopting a Whole Food , Plant Predominant  ( WFPP) nutrition as recommended by [employer] of Lifestyle Medicine. Patient is encouraged to switch to  unprocessed or minimally processed  complex starch, adequate protein intake (mainly plant source), minimal liquid fat ( mainly vegetable oils), plenty of fruits, and vegetables. -  he is advised to stick to a routine mealtimes to eat 3 complete meals a day and snack only when necessary ( to snack only to correct hypoglycemia BG <70 day time or <100 at night).   -He is engaged in lifestyle medicine and benefiting tremendously. - he acknowledges that there is a room for improvement in his food and drink choices. - Suggestion is made for him to avoid simple carbohydrates  from his diet including Cakes, Sweet Desserts, Ice Cream, Soda (diet and regular), Sweet Tea, Candies, Chips, Cookies, Store Bought Juices, Alcohol , Artificial Sweeteners,  Coffee Creamer, and "Sugar-free" Products, Lemonade. This will help patient to have more stable blood glucose profile and potentially avoid unintended weight gain.  The following Lifestyle Medicine recommendations according to American College of  Lifestyle Medicine  [facility]) were discussed and and offered to patient and he  agrees to start the journey:  A. Whole Foods, Plant-Based  Nutrition comprising of fruits and vegetables, plant-based proteins, whole-grain carbohydrates was discussed in detail with the patient.   A list for source of those nutrients were also provided to the patient.  Patient will use only water or unsweetened tea for hydration. B.  The need to stay away from risky substances including alcohol, smoking; obtaining 7 to 9 hours of restorative sleep, at least 150 minutes of moderate intensity exercise weekly, the importance of healthy social connections,  and stress management techniques were discussed. C.  A full color page of  Calorie density of various food groups per pound showing examples of each food groups was provided to the patient.    - I have approached him with the following plan to manage  his diabetes and patient agrees:   -In light of his presentation with controlled glycemic profile, he will not need additional intervention at this time.  He is an ideal candidate for SGLT2 inhibitors given his amputation.  He will be continued on metformin 500 mg p.o. daily at breakfast.   This patient has a good chance to put his diabetes into remission.    - Specific targets for  A1c;  LDL, HDL,  and Triglycerides were discussed with the patient.  2) Blood Pressure /Hypertension:   His blood pressure is controlled to target.  He will be considered for low-dose antihypertensive medication during his next visit if his blood pressure remains above 130/80 mmHg.  He is educated on salt restrictions.   3) Lipids/Hyperlipidemia: His previsit labs show favorable lipid panel including LDL of 58.   He is advised to continue atorvastatin 20 mg p.o. nightly.  He is more than 50% engaged with for food plant-based diet.  4)  Weight/Diet:  Body mass index is 31.8 kg/m.  -He has gained 6 pounds since last visit, clearly complicating  his diabetes care.   he is  a candidate for weight loss. I discussed with him the fact that loss of 5 - 10% of his  current body weight will have the most impact on his diabetes management.  The above detailed  ACLM recommendations for nutrition, exercise, sleep, social life, avoidance of risky substances, the need for restorative sleep   information will also detailed on discharge instructions.  5) hypothyroidism:  -His previsit labs are consistent with inadequate replacement.  I discussed and increase his levothyroxine to 88 mcg p.o. daily before breakfast.   - We discussed about the correct intake of his thyroid hormone, on empty stomach at fasting, with water, separated by at least 30 minutes from breakfast and other medications,  and separated by more than 4 hours from calcium, iron, multivitamins, acid reflux medications (PPIs). -Patient is made aware of the fact that thyroid hormone replacement is needed for life, dose to be adjusted by periodic monitoring of thyroid function tests.    6) Chronic Care/Health Maintenance:  -he  is Statin medications and  is encouraged to initiate and continue to follow up with Ophthalmology, Dentist,  Podiatrist at least yearly or according to recommendations, and advised to   stay away from smoking. I have recommended yearly flu vaccine and pneumonia vaccine at least every 5 years; moderate intensity exercise for up to 150 minutes weekly; and  sleep for 7- 9 hours a day.  - he is  advised to maintain close follow up with Kotturi, Zadie Rhine, MD for primary care needs, as well as his other providers for optimal and coordinated care.   I spent  26  minutes in the care of the patient today including review of labs from CMP, Lipids, Thyroid Function, Hematology (current and previous including abstractions from other facilities); face-to-face time discussing  his blood glucose readings/logs, discussing hypoglycemia and hyperglycemia episodes and symptoms, medications  doses, his options of short and long term treatment based on the latest standards of care / guidelines;  discussion about incorporating lifestyle medicine;  and documenting the encounter. Risk reduction counseling performed per USPSTF guidelines to reduce  obesity and cardiovascular risk factors.     Please refer to Patient Instructions for Blood Glucose Monitoring and Insulin/Medications Dosing Guide"  in media tab for additional information. Please  also refer to " Patient Self Inventory" in the Media  tab for reviewed elements of pertinent patient history.  Marshell Garfinkel participated in the discussions, expressed understanding, and voiced agreement with the above plans.  All questions were answered to his satisfaction. he is encouraged to contact clinic should he have any questions or concerns prior to his return visit.   Follow up plan: - Return in about 6 months (around 11/10/2023) for Fasting Labs  in AM B4 8, A1c -NV, Urine MA - NV.  Marquis Lunch, MD [facility] [address] [place], [place] [address] Phone: [phone]  Fax: [phone]    05/10/2023, 1:54 PM  This note was partially dictated with voice recognition software. Similar sounding words can be transcribed inadequately or may not  be corrected upon review.

## 2023-05-10 NOTE — Patient Instructions (Signed)

## 2023-07-18 DIAGNOSIS — Z89512 Acquired absence of left leg below knee: Secondary | ICD-10-CM | POA: Diagnosis not present

## 2023-09-27 DIAGNOSIS — Z89512 Acquired absence of left leg below knee: Secondary | ICD-10-CM | POA: Diagnosis not present

## 2023-10-27 ENCOUNTER — Other Ambulatory Visit: Payer: Self-pay | Admitting: "Endocrinology

## 2023-11-07 DIAGNOSIS — E039 Hypothyroidism, unspecified: Secondary | ICD-10-CM | POA: Diagnosis not present

## 2023-11-07 DIAGNOSIS — E1165 Type 2 diabetes mellitus with hyperglycemia: Secondary | ICD-10-CM | POA: Diagnosis not present

## 2023-11-07 DIAGNOSIS — E782 Mixed hyperlipidemia: Secondary | ICD-10-CM | POA: Diagnosis not present

## 2023-11-08 LAB — COMPREHENSIVE METABOLIC PANEL
ALT: 14 [IU]/L (ref 0–44)
AST: 22 [IU]/L (ref 0–40)
Albumin: 4.5 g/dL (ref 4.1–5.1)
Alkaline Phosphatase: 64 [IU]/L (ref 44–121)
BUN/Creatinine Ratio: 16 (ref 9–20)
BUN: 14 mg/dL (ref 6–24)
Bilirubin Total: 0.4 mg/dL (ref 0.0–1.2)
CO2: 26 mmol/L (ref 20–29)
Calcium: 9.3 mg/dL (ref 8.7–10.2)
Chloride: 103 mmol/L (ref 96–106)
Creatinine, Ser: 0.89 mg/dL (ref 0.76–1.27)
Globulin, Total: 2.4 g/dL (ref 1.5–4.5)
Glucose: 112 mg/dL — ABNORMAL HIGH (ref 70–99)
Potassium: 4.7 mmol/L (ref 3.5–5.2)
Sodium: 142 mmol/L (ref 134–144)
Total Protein: 6.9 g/dL (ref 6.0–8.5)
eGFR: 106 mL/min/{1.73_m2} (ref >59)

## 2023-11-08 LAB — LIPID PANEL
Chol/HDL Ratio: 2.8 {ratio} (ref 0.0–5.0)
Cholesterol, Total: 146 mg/dL (ref 100–199)
HDL: 52 mg/dL (ref >39)
LDL Chol Calc (NIH): 79 mg/dL (ref 0–99)
Triglycerides: 79 mg/dL (ref 0–149)
VLDL Cholesterol Cal: 15 mg/dL (ref 5–40)

## 2023-11-08 LAB — TSH: TSH: 4.97 u[IU]/mL — ABNORMAL HIGH (ref 0.450–4.500)

## 2023-11-08 LAB — T4, FREE: Free T4: 1.32 ng/dL (ref 0.82–1.77)

## 2023-11-15 ENCOUNTER — Ambulatory Visit (INDEPENDENT_AMBULATORY_CARE_PROVIDER_SITE_OTHER): Payer: BC Managed Care – PPO | Admitting: "Endocrinology

## 2023-11-15 ENCOUNTER — Encounter: Payer: Self-pay | Admitting: "Endocrinology

## 2023-11-15 VITALS — BP 168/100 | HR 84 | Ht 73.0 in | Wt 270.2 lb

## 2023-11-15 DIAGNOSIS — Z6833 Body mass index (BMI) 33.0-33.9, adult: Secondary | ICD-10-CM

## 2023-11-15 DIAGNOSIS — E1165 Type 2 diabetes mellitus with hyperglycemia: Secondary | ICD-10-CM

## 2023-11-15 DIAGNOSIS — E6609 Other obesity due to excess calories: Secondary | ICD-10-CM

## 2023-11-15 DIAGNOSIS — E66811 Obesity, class 1: Secondary | ICD-10-CM

## 2023-11-15 DIAGNOSIS — E782 Mixed hyperlipidemia: Secondary | ICD-10-CM | POA: Diagnosis not present

## 2023-11-15 DIAGNOSIS — Z7984 Long term (current) use of oral hypoglycemic drugs: Secondary | ICD-10-CM

## 2023-11-15 DIAGNOSIS — E039 Hypothyroidism, unspecified: Secondary | ICD-10-CM | POA: Diagnosis not present

## 2023-11-15 LAB — POCT GLYCOSYLATED HEMOGLOBIN (HGB A1C): HbA1c, POC (controlled diabetic range): 5.8 % (ref 0.0–7.0)

## 2023-11-15 MED ORDER — LOSARTAN POTASSIUM-HCTZ 50-12.5 MG PO TABS: 1.0000 | ORAL_TABLET | Freq: Every day | ORAL | 1 refills | Status: DC

## 2023-11-15 MED ORDER — LEVOTHYROXINE SODIUM 100 MCG PO TABS: 100.0000 ug | ORAL_TABLET | Freq: Every day | ORAL | 1 refills | Status: DC

## 2023-11-15 MED ORDER — ATORVASTATIN CALCIUM 20 MG PO TABS
20.0000 mg | ORAL_TABLET | Freq: Every day | ORAL | 1 refills | Status: DC
Start: 2023-11-15 — End: 2024-05-26

## 2023-11-15 NOTE — Progress Notes (Signed)
11/15/2023, 2:06 PM  Endocrinology follow-up note   Subjective:    Patient ID: Dakota Matthews, male    DOB: 12/29/1975.  Dakota Matthews is being seen in follow-up after he was seen in consultation for management of currently uncontrolled symptomatic diabetes requested by  Beatrix Fetters, MD.   Past Medical History:  Diagnosis Date   Diabetes [facility])     Past Surgical History:  Procedure Laterality Date   BELOW KNEE LEG AMPUTATION Left 03/2022    Social History   Socioeconomic History   Marital status: Divorced    Spouse name: Not on file   Number of children: Not on file   Years of education: Not on file   Highest education level: Not on file  Occupational History   Not on file  Tobacco Use   Smoking status: Former    Current packs/day: 0.00    Types: Cigarettes    Quit date: 04/23/2021    Years since quitting: 2.5   Smokeless tobacco: Current  Vaping Use   Vaping status: Never Used  Substance and Sexual Activity   Alcohol use: Yes    Alcohol/week: 1.0 standard drink of alcohol    Types: 1 Cans of beer per week    Comment: Occ, stopped in May   Drug use: Never   Sexual activity: Not on file  Other Topics Concern   Not on file  Social History Narrative   Not on file   Social Determinants of Health   Financial Resource Strain: Low Risk  (04/01/2023)   Received from [facility], [facility]   Overall Financial Resource Strain (CARDIA)    Difficulty of Paying Living Expenses: Not very hard  Food Insecurity: No Food Insecurity (04/01/2023)   Received from [facility], [facility]   Hunger Vital Sign    Worried About Running Out of Food in the Last Year: Never true    Ran Out of Food in the Last Year: Never true  Transportation Needs: No Transportation Needs (04/01/2023)   Received from [facility], [facility]   PRAPARE - Transportation    Lack of Transportation (Medical): No     Lack of Transportation (Non-Medical): No  Physical Activity: Sufficiently Active (04/01/2023)   Received from [facility], [facility]   Exercise Vital Sign    Days of Exercise per Week: 7 days    Minutes of Exercise per Session: 150+ min  Stress: No Stress Concern Present (04/01/2023)   Received from [facility], [facility] - Occupational Stress Questionnaire    Feeling of Stress : Not at all  Social Connections: Socially Integrated (04/01/2023)   Received from [facility], [facility]   Social Network    How would you rate your social network (family, work, friends)?: Good participation with social networks    Family History  Problem Relation Age of Onset   Cancer Father     Outpatient Encounter Medications as of 11/15/2023  Medication Sig   atorvastatin (LIPITOR) 20 MG tablet Take 1 tablet (20 mg total) by mouth daily.   losartan-hydrochlorothiazide (HYZAAR) 50-12.5 MG tablet Take 1 tablet by mouth daily.  Multiple Vitamin (MULTIVITAMIN ADULT PO) Take 1 tablet by mouth daily.   ceFEPIme 2 g in sodium chloride 0.9 % 100 mL Inject 2 g into the vein every 12 (twelve) hours.   levothyroxine (SYNTHROID) 100 MCG tablet Take 1 tablet (100 mcg total) by mouth daily before breakfast.   metFORMIN (GLUCOPHAGE) 500 MG tablet Take 500 mg by mouth daily after breakfast.   [DISCONTINUED] atorvastatin (LIPITOR) 20 MG tablet Take 20 mg by mouth daily. (Patient not taking: Reported on 11/15/2023)   [DISCONTINUED] Cholecalciferol (DIALYVITE VITAMIN D 5000) 125 MCG (5000 UT) capsule Take 1 capsule (5,000 Units total) by mouth daily with supper.   [DISCONTINUED] Docusate Sodium (DSS) 100 MG CAPS Take by mouth.   [DISCONTINUED] HYDROcodone-acetaminophen (NORCO/VICODIN) 5-325 MG tablet Take 1 tablet by mouth every 6 (six) hours as needed.   [DISCONTINUED] levothyroxine (SYNTHROID) 88 MCG tablet TAKE 1 TABLET BY MOUTH DAILY   [DISCONTINUED] vitamin C  (ASCORBIC ACID) 500 MG tablet Take 500 mg by mouth daily.   No facility-administered encounter medications on file as of 11/15/2023.    ALLERGIES: No Known Allergies  VACCINATION STATUS:  There is no immunization history on file for this patient.  Diabetes He presents for his follow-up diabetic visit. He has type 2 diabetes mellitus. Onset time: He was diagnosed in January 2023 at age of 26. 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 There are no hypoglycemic associated symptoms. Pertinent negatives for hypoglycemia include no confusion, headaches, pallor or seizures. Pertinent negatives for diabetes include no chest pain, no fatigue, no polydipsia, no polyphagia, no polyuria and no weakness. There are no hypoglycemic complications. Symptoms are stable. Diabetic complications include peripheral neuropathy and PVD. (He underwent left BKA after complicated osteomyelitis on March 20, 2022.) Risk factors for coronary artery disease include dyslipidemia, diabetes mellitus, male sex, obesity, sedentary lifestyle and tobacco exposure. Current diabetic treatments: He is on Lantus 30 units nightly and metformin 1000 mg twice a day. His weight is fluctuating minimally. He is following a generally unhealthy diet. When asked about meal planning, he reported none. He has not had a previous visit with a dietitian. He never participates in exercise. There is no change in his home blood glucose trend. (This patient presents with continued control in his diabetes with point-of-care A1c of 6.1% overall improving from 10.4%.   He is only on metformin 500 mg p.o. daily at breakfast.   He did not document any hypoglycemia. ) An ACE inhibitor/angiotensin II receptor blocker is not being taken.  Hyperlipidemia This is a chronic problem. The current episode started more than 1 month ago. Exacerbating diseases include diabetes  and obesity. Pertinent negatives include no chest pain, myalgias or shortness of breath. Current antihyperlipidemic treatment includes statins. Risk factors for coronary artery disease include diabetes mellitus, dyslipidemia, family history, obesity, male sex, hypertension and a sedentary lifestyle.  Hypertension This is a new problem. The current episode started more than 1 year ago. The problem has been rapidly worsening since onset. Pertinent negatives include no chest pain, headaches, neck pain, palpitations or shortness of breath. Risk factors for coronary artery disease include dyslipidemia, diabetes mellitus, family history and male gender. Treatments tried: He is not on treatment for hypertension. Hypertensive end-organ damage includes PVD.     Review of Systems  Constitutional:  Negative for chills, fatigue, fever and unexpected weight change.  HENT:  Negative for dental problem, mouth sores and trouble swallowing.  Eyes:  Negative for visual disturbance.  Respiratory:  Negative for cough, choking, chest tightness, shortness of breath and wheezing.   Cardiovascular:  Negative for chest pain, palpitations and leg swelling.  Gastrointestinal:  Negative for abdominal distention, abdominal pain, constipation, diarrhea, nausea and vomiting.  Endocrine: Negative for polydipsia, polyphagia and polyuria.  Genitourinary:  Negative for dysuria, flank pain, hematuria and urgency.  Musculoskeletal:  Negative for back pain, gait problem, myalgias and neck pain.  Skin:  Negative for pallor, rash and wound.  Neurological:  Negative for seizures, syncope, weakness, numbness and headaches.  Psychiatric/Behavioral:  Negative for confusion and dysphoric mood.     Objective:       11/15/2023    9:42 AM 05/10/2023   10:33 AM 11/09/2022   11:27 AM  Vitals with BMI  Height 6\' 1"  6\' 1"  6\' 1"   Weight 270 lbs 3 oz 241 lbs 234 lbs 10 oz  BMI 35.66 31.8 30.96  Systolic 168 136 409  Diastolic 100 82 88   Pulse 84 92 52    BP (!) 168/100 Comment: R arm manual cuff. Dr.Catrice Zuleta made aware.  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   CMP ( most recent) CMP     Component Value Date/Time   NA 142 11/07/2023 0926   K 4.7 11/07/2023 0926   CL 103 11/07/2023 0926   CO2 26 11/07/2023 0926   GLUCOSE 112 (H) 11/07/2023 0926   GLUCOSE 101 (H) 01/26/2022 0400   BUN 14 11/07/2023 0926   CREATININE 0.89 11/07/2023 0926   CALCIUM 9.3 11/07/2023 0926   PROT 6.9 11/07/2023 0926   ALBUMIN 4.5 11/07/2023 0926   AST 22 11/07/2023 0926   ALT 14 11/07/2023 0926   ALKPHOS 64 11/07/2023 0926   BILITOT 0.4 11/07/2023 0926   GFRNONAA >60 01/26/2022 0400     Diabetic Labs (most recent): Lab Results  Component Value Date   HGBA1C 5.8 11/15/2023   HGBA1C 6.0 11/09/2022   HGBA1C 5.7 08/03/2022   Lipid Panel     Component Value Date/Time   CHOL 146 11/07/2023 0926   TRIG 79 11/07/2023 0926   HDL 52 11/07/2023 0926   CHOLHDL 2.8 11/07/2023 0926   LDLCALC 79 11/07/2023 0926   LABVLDL 15 11/07/2023 0926     Assessment & Plan:   1. Poorly controlled type 2 diabetes mellitus (HCC)   - Dakota Matthews has currently uncontrolled symptomatic type 2 DM since  47 years of age.  This patient presents with continued control in his diabetes with point-of-care A1c of 5.8% overall improving from 10.4%.   He is only on metformin 500 mg p.o. daily at breakfast.   He did not document any hypoglycemia.  His recent labs are reviewed. He was diagnosed with diabetes with A1c of 11.4% January 2023.  Admittedly, he has not seen primary care doctor in several years prior to that. - I had a long discussion with him about the progressive nature of diabetes and  the pathology behind its complications. -his diabetes is complicated by diabetic foot ulcer complicated by osteomyelitis requiring debridement as well as ongoing antibiotic treatment  and he remains at a high risk for more acute and chronic complications which include CAD, CVA, CKD, retinopathy, and neuropathy. These are all discussed in detail with him.  - I discussed all available options of managing his diabetes including de-escalation of medications. I have counseled him on diet  and weight management  by adopting a Whole Food , Plant Predominant  ( WFPP) nutrition as recommended by [employer] of Lifestyle Medicine. Patient is encouraged to switch to  unprocessed or minimally processed  complex starch, adequate protein intake (mainly plant source), minimal liquid fat ( mainly vegetable oils), plenty of fruits, and vegetables. -  he is advised to stick to a routine mealtimes to eat 3 complete meals a day and snack only when necessary ( to snack only to correct hypoglycemia BG <70 day time or <100 at night).   -He is engaged in lifestyle medicine and benefiting tremendously. - he acknowledges that there is a room for improvement in his food and drink choices. - Suggestion is made for him to avoid simple carbohydrates  from his diet including Cakes, Sweet Desserts, Ice Cream, Soda (diet and regular), Sweet Tea, Candies, Chips, Cookies, Store Bought Juices, Alcohol , Artificial Sweeteners,  Coffee Creamer, and "Sugar-free" Products, Lemonade. This will help patient to have more stable blood glucose profile and potentially avoid unintended weight gain.  The following Lifestyle Medicine recommendations according to American College of Lifestyle Medicine  [facility]) were discussed and and offered to patient and he  agrees to start the journey:  A. Whole Foods, Plant-Based Nutrition comprising of fruits and vegetables, plant-based proteins, whole-grain carbohydrates was discussed in detail with the patient.    A list for source of those nutrients were also provided to the patient.  Patient will use only water or unsweetened tea for hydration. B.  The need to stay away from risky substances including alcohol, smoking; obtaining 7 to 9 hours of restorative sleep, at least 150 minutes of moderate intensity exercise weekly, the importance of healthy social connections,  and stress management techniques were discussed. C.  A full color page of  Calorie density of various food groups per pound showing examples of each food groups was provided to the patient.   - I have approached him with the following plan to manage  his diabetes and patient agrees:   -In light of his presentation with controlled glycemic profile, he will not need additional intervention at this time.  He is not an ideal candidate for SGLT2 inhibitors given his amputation.  He is advised to continue metformin 500 mg p.o. daily at breakfast.    This patient has a good chance to put his diabetes into remission.    - Specific targets for  A1c;  LDL, HDL,  and Triglycerides were discussed with the patient.  2) Blood Pressure /Hypertension:   His blood pressure is not controlled to target and getting worse.  He is approached for antihypertensive medications and he is open to consider.  I discussed and prescribed losartan/HCTZ 50/12.5 mg p.o. daily at breakfast.  He is urged to fill this prescription as soon as he can and start taking. His blood pressure was 168/100 at intake, patient could not wait for repeat blood pressure measurement, had to leave for family emergency.  He is educated on salt restrictions.   3) Lipids/Hyperlipidemia: His previsit labs show favorable lipid panel including LDL of 79 increasing from 58.   He is advised to resume atorvastatin 20 mg p.o. nightly.  Side effects and precautions discussed with him.  4)  Weight/Diet:  Body mass index is 35.65 kg/m.  -He has gained 6 pounds since last visit, clearly complicating  his diabetes care.   he is  a candidate for weight loss. I discussed with him the fact that loss of 5 - 10% of his  current body weight will have the most impact on his diabetes management.  The above detailed  ACLM recommendations for nutrition, exercise, sleep, social life, avoidance of risky substances, the need for restorative sleep   information will also detailed on discharge instructions.  5) hypothyroidism:  -His previsit labs are consistent with inadequate replacement.  I discussed and increase his levothyroxine to 100 mcg p.o. daily before breakfast.   - We discussed about the correct intake of his thyroid hormone, on empty stomach at fasting, with water, separated by at least 30 minutes from breakfast and other medications,  and separated by more than 4 hours from calcium, iron, multivitamins, acid reflux medications (PPIs). -Patient is made aware of the fact that thyroid hormone replacement is needed for life, dose to be adjusted by periodic monitoring of thyroid function tests.  6) Chronic Care/Health Maintenance:  -he  is Statin medications and  is encouraged to initiate and continue to follow up with Ophthalmology, Dentist,  Podiatrist at least yearly or according to recommendations, and advised to   stay away from smoking. I have recommended yearly flu vaccine and pneumonia vaccine at least every 5 years; moderate intensity exercise for up to 150 minutes weekly; and  sleep for 7- 9 hours a day.  - he is  advised to maintain close follow up with Dakota Matthews, Zadie Rhine, MD for primary care needs, as well as his other providers for optimal and coordinated care.   I spent  41  minutes in the care of the patient today including review of labs from CMP, Lipids, Thyroid Function, Hematology (current and previous including abstractions from other facilities); face-to-face time discussing  his blood glucose readings/logs, discussing hypoglycemia and hyperglycemia episodes and symptoms, medications  doses, his options of short and long term treatment based on the latest standards of care / guidelines;  discussion about incorporating lifestyle medicine;  and documenting the encounter. Risk reduction counseling performed per USPSTF guidelines to reduce  obesity and cardiovascular risk factors.     Please refer to Patient Instructions for Blood Glucose Monitoring and Insulin/Medications Dosing Guide"  in media tab for additional information. Please  also refer to " Patient Self Inventory" in the Media  tab for reviewed elements of pertinent patient history.  Dakota Matthews participated in the discussions, expressed understanding, and voiced agreement with the above plans.  All questions were answered to his satisfaction. he is encouraged to contact clinic should he have any questions or concerns prior to his return visit.    Follow up plan: - Return in about 6 months (around 05/14/2024) for Fasting Labs  in AM B4 8, A1c -NV.  Marquis Lunch, MD [facility] [address] [place], [place] [address] Phone: [phone]  Fax: [phone]    11/15/2023, 2:06 PM  This note was partially dictated with voice recognition software. Similar sounding words can be transcribed inadequately or may not  be corrected upon review.

## 2023-11-19 DIAGNOSIS — Z89512 Acquired absence of left leg below knee: Secondary | ICD-10-CM | POA: Diagnosis not present

## 2023-11-29 ENCOUNTER — Telehealth: Payer: Self-pay | Admitting: Nurse Practitioner

## 2023-11-29 NOTE — Telephone Encounter (Signed)
Sent medical records request from [employer] to medical records to release

## 2024-04-22 DIAGNOSIS — T879 Unspecified complications of amputation stump: Secondary | ICD-10-CM | POA: Diagnosis not present

## 2024-04-22 DIAGNOSIS — Z89512 Acquired absence of left leg below knee: Secondary | ICD-10-CM | POA: Diagnosis not present

## 2024-04-29 ENCOUNTER — Other Ambulatory Visit: Payer: Self-pay | Admitting: "Endocrinology

## 2024-05-07 ENCOUNTER — Other Ambulatory Visit: Payer: Self-pay | Admitting: "Endocrinology

## 2024-05-08 DIAGNOSIS — E1165 Type 2 diabetes mellitus with hyperglycemia: Secondary | ICD-10-CM | POA: Diagnosis not present

## 2024-05-08 DIAGNOSIS — E039 Hypothyroidism, unspecified: Secondary | ICD-10-CM | POA: Diagnosis not present

## 2024-05-09 LAB — COMPREHENSIVE METABOLIC PANEL WITH GFR
ALT: 20 IU/L (ref 0–44)
AST: 20 IU/L (ref 0–40)
Albumin: 4.8 g/dL (ref 4.1–5.1)
Alkaline Phosphatase: 61 IU/L (ref 44–121)
BUN/Creatinine Ratio: 17 (ref 9–20)
BUN: 29 mg/dL — ABNORMAL HIGH (ref 6–24)
Bilirubin Total: 0.4 mg/dL (ref 0.0–1.2)
CO2: 26 mmol/L (ref 20–29)
Calcium: 9.4 mg/dL (ref 8.7–10.2)
Chloride: 100 mmol/L (ref 96–106)
Creatinine, Ser: 1.71 mg/dL — ABNORMAL HIGH (ref 0.76–1.27)
Globulin, Total: 2 g/dL (ref 1.5–4.5)
Glucose: 80 mg/dL (ref 70–99)
Potassium: 3.8 mmol/L (ref 3.5–5.2)
Sodium: 140 mmol/L (ref 134–144)
Total Protein: 6.8 g/dL (ref 6.0–8.5)
eGFR: 49 mL/min/{1.73_m2} — ABNORMAL LOW (ref >59)

## 2024-05-09 LAB — LIPID PANEL
Chol/HDL Ratio: 2.5 ratio (ref 0.0–5.0)
Cholesterol, Total: 123 mg/dL (ref 100–199)
HDL: 50 mg/dL (ref >39)
LDL Chol Calc (NIH): 51 mg/dL (ref 0–99)
Triglycerides: 126 mg/dL (ref 0–149)
VLDL Cholesterol Cal: 22 mg/dL (ref 5–40)

## 2024-05-09 LAB — TSH: TSH: 3.58 u[IU]/mL (ref 0.450–4.500)

## 2024-05-09 LAB — T4, FREE: Free T4: 1.18 ng/dL (ref 0.82–1.77)

## 2024-05-13 ENCOUNTER — Telehealth: Payer: Self-pay | Admitting: "Endocrinology

## 2024-05-13 NOTE — Telephone Encounter (Signed)
 Pt did labs for May appt and then moved appt to July.  Does he need to redo labs?  If so can you put order in.

## 2024-05-15 ENCOUNTER — Ambulatory Visit: Payer: BC Managed Care – PPO | Admitting: "Endocrinology

## 2024-05-19 ENCOUNTER — Other Ambulatory Visit: Payer: Self-pay | Admitting: "Endocrinology

## 2024-05-19 DIAGNOSIS — E039 Hypothyroidism, unspecified: Secondary | ICD-10-CM

## 2024-05-19 NOTE — Telephone Encounter (Signed)
**Note De-identified  [place] Obfuscation** Please advise 

## 2024-05-19 NOTE — Telephone Encounter (Signed)
 Sent pt my chart message and mailed labs to pt

## 2024-05-26 ENCOUNTER — Other Ambulatory Visit: Payer: Self-pay | Admitting: "Endocrinology

## 2024-06-25 DIAGNOSIS — E039 Hypothyroidism, unspecified: Secondary | ICD-10-CM | POA: Diagnosis not present

## 2024-06-26 LAB — T4, FREE: Free T4: 1.17 ng/dL (ref 0.82–1.77)

## 2024-06-26 LAB — TSH: TSH: 5.16 u[IU]/mL — ABNORMAL HIGH (ref 0.450–4.500)

## 2024-07-03 ENCOUNTER — Ambulatory Visit: Admitting: "Endocrinology

## 2024-07-03 ENCOUNTER — Other Ambulatory Visit (HOSPITAL_COMMUNITY): Payer: Self-pay

## 2024-07-03 ENCOUNTER — Encounter: Payer: Self-pay | Admitting: "Endocrinology

## 2024-07-03 ENCOUNTER — Telehealth: Payer: Self-pay

## 2024-07-03 VITALS — BP 162/96 | HR 92 | Ht 73.0 in | Wt 266.6 lb

## 2024-07-03 DIAGNOSIS — Z6833 Body mass index (BMI) 33.0-33.9, adult: Secondary | ICD-10-CM

## 2024-07-03 DIAGNOSIS — E1122 Type 2 diabetes mellitus with diabetic chronic kidney disease: Secondary | ICD-10-CM | POA: Diagnosis not present

## 2024-07-03 DIAGNOSIS — E039 Hypothyroidism, unspecified: Secondary | ICD-10-CM | POA: Diagnosis not present

## 2024-07-03 DIAGNOSIS — Z7985 Long-term (current) use of injectable non-insulin antidiabetic drugs: Secondary | ICD-10-CM

## 2024-07-03 DIAGNOSIS — N1831 Chronic kidney disease, stage 3a: Secondary | ICD-10-CM | POA: Diagnosis not present

## 2024-07-03 DIAGNOSIS — E66811 Obesity, class 1: Secondary | ICD-10-CM | POA: Diagnosis not present

## 2024-07-03 DIAGNOSIS — E782 Mixed hyperlipidemia: Secondary | ICD-10-CM

## 2024-07-03 DIAGNOSIS — E6609 Other obesity due to excess calories: Secondary | ICD-10-CM

## 2024-07-03 DIAGNOSIS — I10 Essential (primary) hypertension: Secondary | ICD-10-CM | POA: Insufficient documentation

## 2024-07-03 DIAGNOSIS — I129 Hypertensive chronic kidney disease with stage 1 through stage 4 chronic kidney disease, or unspecified chronic kidney disease: Secondary | ICD-10-CM

## 2024-07-03 LAB — POCT GLYCOSYLATED HEMOGLOBIN (HGB A1C): HbA1c, POC (controlled diabetic range): 6.1 % (ref 0.0–7.0)

## 2024-07-03 MED ORDER — TIRZEPATIDE 2.5 MG/0.5ML ~~LOC~~ SOAJ: 2.5000 mg | SUBCUTANEOUS | 0 refills | Status: DC

## 2024-07-03 MED ORDER — LEVOTHYROXINE SODIUM 112 MCG PO TABS: 112.0000 ug | ORAL_TABLET | Freq: Every day | ORAL | 1 refills | Status: DC

## 2024-07-03 NOTE — Patient Instructions (Signed)

## 2024-07-03 NOTE — Progress Notes (Signed)
 07/03/2024, 11:25 AM  Endocrinology follow-up note   Subjective:    Patient ID: Dakota Matthews, male    DOB: 15-Matthews-1977.  Dakota Matthews is being seen in follow-up after he was seen in consultation for management of currently uncontrolled symptomatic diabetes requested by  Kotturi, Vinay K, MD.   Past Medical History:  Diagnosis Date   Diabetes [facility])     Past Surgical History:  Procedure Laterality Date   BELOW KNEE LEG AMPUTATION Left 03/2022    Social History   Socioeconomic History   Marital status: Divorced    Spouse name: Not on file   Number of children: Not on file   Years of education: Not on file   Highest education level: Not on file  Occupational History   Not on file  Tobacco Use   Smoking status: Former    Current packs/day: 0.00    Types: Cigarettes    Quit date: 04/23/2021    Years since quitting: 3.1   Smokeless tobacco: Current  Vaping Use   Vaping status: Never Used  Substance and Sexual Activity   Alcohol use: Yes    Alcohol/week: 1.0 standard drink of alcohol    Types: 1 Cans of beer per week    Comment: Occ, stopped in May   Drug use: Never   Sexual activity: Not on file  Other Topics Concern   Not on file  Social History Narrative   Not on file   Social Drivers of Health   Financial Resource Strain: Medium Risk (04/21/2024)   Received from [facility]   Overall Financial Resource Strain (CARDIA)    Difficulty of Paying Living Expenses: Somewhat hard  Food Insecurity: No Food Insecurity (04/21/2024)   Received from [facility]   Hunger Vital Sign    Within the past 12 months, you worried that your food would run out before you got the money to buy more.: Never true    Within the past 12 months, the food you bought just didn't last and you didn't have money to get more.: Never true  Transportation Needs: No Transportation Needs (04/21/2024)   Received from [facility] - Transportation    Lack of Transportation (Medical): No    Lack of Transportation (Non-Medical): No  Physical Activity: Sufficiently Active (04/21/2024)   Received from [facility]   Exercise Vital Sign    On average, how many days per week do you engage in moderate to strenuous exercise (like a brisk walk)?: 7 days    On average, how many minutes do you engage in exercise at this level?: 150+ min  Stress: No Stress Concern Present (04/21/2024)   Received from [facility] - Occupational Stress Questionnaire    Feeling of Stress : Not at all  Social Connections: Socially Integrated (04/21/2024)   Received from [facility]   Social Network    How would you rate your social network (family, work, friends)?: Good participation with social networks    Family History  Problem Relation Age of Onset   Cancer Father     Outpatient Encounter Medications as of 07/03/2024  Medication Sig  tirzepatide  (MOUNJARO ) 2.5 MG/0.5ML Pen Inject 2.5 mg into the skin once a week.   atorvastatin  (LIPITOR) 20 MG tablet TAKE 1 TABLET BY MOUTH DAILY   ceFEPIme 2 g in sodium chloride 0.9 % 100 mL Inject 2 g into the vein every 12 (twelve) hours.   levothyroxine  (SYNTHROID ) 112 MCG tablet Take 1 tablet (112 mcg total) by mouth daily before breakfast.   losartan -hydrochlorothiazide (HYZAAR) 50-12.5 MG tablet TAKE 1 TABLET BY MOUTH DAILY   Multiple Vitamin (MULTIVITAMIN ADULT PO) Take 1 tablet by mouth daily.   [DISCONTINUED] levothyroxine  (SYNTHROID ) 100 MCG tablet TAKE 1 TABLET BY MOUTH DAILY BEFORE BREAKFAST   [DISCONTINUED] metFORMIN (GLUCOPHAGE) 500 MG tablet Take 500 mg by mouth daily after breakfast.   No facility-administered encounter medications on file as of 07/03/2024.    ALLERGIES: No Known Allergies  VACCINATION STATUS:  There is no immunization history on file for this patient.  Diabetes He presents for his follow-up diabetic  visit. He has type 2 diabetes mellitus. Onset time: He was diagnosed in January 2023 at age of 36. 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 There are no hypoglycemic associated symptoms. Pertinent negatives for hypoglycemia include no confusion, headaches, pallor or seizures. Pertinent negatives for diabetes include no chest pain, no fatigue, no polydipsia, no polyphagia, no polyuria and no weakness. There are no hypoglycemic complications. Symptoms are stable. Diabetic complications include peripheral neuropathy and PVD. (He underwent left BKA after complicated osteomyelitis on March 20, 2022.) Risk factors for coronary artery disease include dyslipidemia, diabetes mellitus, male sex, obesity, sedentary lifestyle and tobacco exposure. Current diabetic treatments: He is on Lantus  30 units nightly and metformin 1000 mg twice a day. His weight is fluctuating minimally. He is following a generally unhealthy diet. When asked about meal planning, he reported none. He has not had a previous visit with a dietitian. He never participates in exercise. His home blood glucose trend is fluctuating minimally. (This patient presents with continued controlled glycemic profile with point-of-care A1c of 6.1%.  He remains on metformin 500 mg p.o. daily with breakfast.  He is not monitoring blood glucose.  His previsit labs show significant increase in his serum creatinine and drop in his GFR. ) An ACE inhibitor/angiotensin II receptor blocker is not being taken.  Hyperlipidemia This is a chronic problem. The current episode started more than 1 month ago. Exacerbating diseases include diabetes and obesity. Pertinent negatives include no chest pain, myalgias or shortness of breath. Current antihyperlipidemic treatment includes statins. Risk factors for coronary artery disease include diabetes mellitus, dyslipidemia, family history,  obesity, male sex, hypertension and a sedentary lifestyle.  Hypertension This is a new problem. The current episode started more than 1 year ago. The problem has been rapidly worsening since onset. Pertinent negatives include no chest pain, headaches, neck pain, palpitations or shortness of breath. Risk factors for coronary artery disease include dyslipidemia, diabetes mellitus, family history and male gender. Treatments tried: He has not been taking his blood pressure medication consistently. Hypertensive end-organ damage includes PVD.       Objective:       07/03/2024   10:13 AM 11/15/2023    9:42 AM 05/10/2023   10:33 AM  Vitals with BMI  Height 6' 1 6' 1 6' 1  Weight 266 lbs 10 oz 270 lbs 3 oz 241 lbs  BMI 35.18 35.66 31.8  Systolic 162 168 863  Diastolic 96 100 82  Pulse 92 84 92    BP (!) 162/96 Comment: pt states he has not taken his medication this morning. Pt refused recheck of BP.  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   CMP ( most recent) CMP     Component Value Date/Time   NA 140 05/08/2024 1433   K 3.8 05/08/2024 1433   CL 100 05/08/2024 1433   CO2 26 05/08/2024 1433   GLUCOSE 80 05/08/2024 1433   GLUCOSE 101 (H) 01/26/2022 0400   BUN 29 (H) 05/08/2024 1433   CREATININE 1.71 (H) 05/08/2024 1433   CALCIUM  9.4 05/08/2024 1433   PROT 6.8 05/08/2024 1433   ALBUMIN 4.8 05/08/2024 1433   AST 20 05/08/2024 1433   ALT 20 05/08/2024 1433   ALKPHOS 61 05/08/2024 1433   BILITOT 0.4 05/08/2024 1433   GFRNONAA >60 01/26/2022 0400     Diabetic Labs (most recent): Lab Results  Component Value Date   HGBA1C 6.1 07/03/2024   HGBA1C 5.8 11/15/2023   HGBA1C 6.0 11/09/2022   Lipid Panel     Component  Value Date/Time   CHOL 123 05/08/2024 1433   TRIG 126 05/08/2024 1433   HDL 50 05/08/2024 1433   CHOLHDL 2.5 05/08/2024 1433   LDLCALC 51 05/08/2024 1433   LABVLDL 22 05/08/2024 1433     Assessment & Plan:   1. 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  This patient presents with continued controlled glycemic profile with point-of-care A1c of 6.1%.  He remains on metformin 500 mg p.o. daily with breakfast.  He is not monitoring blood glucose.  His previsit labs show significant increase in his serum creatinine and drop in his GFR.   His recent labs are reviewed. He was diagnosed with diabetes with A1c of 11.4% January 2023.  Admittedly, he has not seen primary care doctor in several years prior to that. - I had a long discussion with him about the progressive nature of diabetes and the pathology behind its complications. -his diabetes is complicated by diabetic foot ulcer complicated by osteomyelitis requiring debridement as well as ongoing antibiotic treatment and he remains at a high risk for more acute and chronic complications which include CAD, CVA, CKD, retinopathy, and neuropathy. These are all discussed in detail with him.  - I discussed all available options of managing his diabetes including de-escalation of medications. I have counseled him on diet  and weight management  by adopting a Whole Food , Plant Predominant  ( WFPP) nutrition as recommended by [employer] of Lifestyle Medicine. Patient is encouraged to switch to  unprocessed or minimally processed  complex starch, adequate protein intake ,  minimal liquid fat, plenty of fruits, and vegetables. -  he is advised to stick to a routine mealtimes to eat 3 complete meals a day and snack only when necessary ( to snack only to correct hypoglycemia BG <70 day time or <100 at night).    - he acknowledges that there  is a room for improvement  in his food and drink choices. - Suggestion is made for him to avoid simple carbohydrates  from his diet including Cakes, Sweet Desserts, Ice Cream, Soda (diet and regular), Sweet Tea, Candies, Chips, Cookies, Store Bought Juices, Alcohol , Artificial Sweeteners,  Coffee Creamer, and Sugar-free Products, Lemonade. This will help patient to have more stable blood glucose profile and potentially avoid unintended weight gain.   - I have approached him with the following plan to manage  his diabetes and patient agrees:   -In light of his presentation with CKD with acute drop in his GFR, he is advised to discontinue metformin.    He is not an ideal candidate for SGLT2 inhibitors given his amputation.   - He will benefit from GLP-1 receptor agonist.  I discussed and prescribed Mounjaro  2.5 mg subcutaneously weekly.  This medication will be advanced as he tolerates.   - Specific targets for  A1c;  LDL, HDL,  and Triglycerides were discussed with the patient.  2) Blood Pressure /Hypertension:   His blood pressure is not controlled to target and getting worse.  He was advised to take blood pressure medication during his last visit.  Patient has not been consistent with this medication which involve losartan /HCTZ 50/12.5 mg.  His blood pressure today was found to be 162/96, 168/100 during his last visit.  This could be the main reason why he developed CKD in the interval.  He is advised to start this medication and be consistent with it daily.   He is educated on salt restrictions. He did not give clinic staff a chance to repeat his blood pressure before he left the clinic.  Patient displayed this behavior during his last visit also.  See notes from previous visits. He is advised to get blood pressure cuff and and weight scales for home use.  3) Lipids/Hyperlipidemia: His previsit labs show favorable lipid panel including LDL at 51.  He is advised to continue atorvastatin  20 mg  nightly.  Side effects and precautions discussed with him.  4)  Weight/Diet:  Body mass index is 35.17 kg/m.  -He has gained 6 pounds since last visit, clearly complicating his diabetes care.   he is  a candidate for weight loss. I discussed with him the fact that loss of 5 - 10% of his  current body weight will have the most impact on his diabetes management.  The above detailed  ACLM recommendations for nutrition, exercise, sleep, social life, avoidance of risky substances, the need for restorative sleep   information will also detailed on discharge instructions.  5) hypothyroidism:  -His previsit labs are consistent with inadequate replacement.  I discussed and increased his levothyroxine  to 112 mcg p.o. daily before breakfast.    - We discussed about the correct intake of his thyroid  hormone, on empty stomach at fasting, with water, separated by at least 30 minutes from breakfast and other medications,  and separated by more than 4 hours from calcium , iron, multivitamins, acid reflux medications (PPIs). -Patient is made aware of the fact that thyroid  hormone replacement is needed for life, dose to be adjusted by periodic monitoring of thyroid  function tests.   6) Chronic Care/Health Maintenance:  -he  is Statin medications and  is encouraged to initiate and continue to follow up with Ophthalmology, Dentist,  Podiatrist at least yearly or according to recommendations, and advised to   stay away from smoking. I have recommended yearly flu vaccine and pneumonia vaccine at least every 5 years; moderate intensity  exercise for up to 150 minutes weekly; and  sleep for 7- 9 hours a day.  - he is  advised to maintain close follow up with Kotturi, Vinay K, MD for primary care needs, as well as his other providers for optimal and coordinated care.    I spent  41  minutes in the care of the patient today including review of labs from CMP, Lipids, Thyroid  Function, Hematology (current and previous  including abstractions from other facilities); face-to-face time discussing  his blood glucose readings/logs, discussing hypoglycemia and hyperglycemia episodes and symptoms, medications doses, his options of short and long term treatment based on the latest standards of care / guidelines;  discussion about incorporating lifestyle medicine;  and documenting the encounter. Risk reduction counseling performed per USPSTF guidelines to reduce  obesity and cardiovascular risk factors.     Please refer to Patient Instructions for Blood Glucose Monitoring and Insulin /Medications Dosing Guide  in media tab for additional information. Please  also refer to  Patient Self Inventory in the Media  tab for reviewed elements of pertinent patient history.  Lynwood Finder participated in the discussions, expressed understanding, and voiced agreement with the above plans.  All questions were answered to his satisfaction. he is encouraged to contact clinic should he have any questions or concerns prior to his return visit.     Follow up plan: - Return in about 4 months (around 11/03/2024) for F/U with Pre-visit Labs, A1c -NV.  Ranny Earl, MD [facility] [address] [place], [place] [address] Phone: [phone]  Fax: [phone]    07/03/2024, 11:25 AM  This note was partially dictated with voice recognition software. Similar sounding words can be transcribed inadequately or may not  be corrected upon review.

## 2024-07-03 NOTE — Telephone Encounter (Signed)
 Pharmacy Patient Advocate Encounter   Received notification from CoverMyMeds that prior authorization for Mounjaro  2.5mg  is required/requested.   Insurance verification completed.   The patient is insured through [facility] .   Per test claim: PA required; PA submitted to above mentioned insurance via CoverMyMeds Key/confirmation #/EOC [id number] Status is pending

## 2024-07-08 NOTE — Telephone Encounter (Signed)
 Pharmacy Patient Advocate Encounter  Received notification from [facility] that Prior Authorization for Mounjaro  2.5mg  has been APPROVED from 07/03/24 to 07/03/25

## 2024-08-09 ENCOUNTER — Other Ambulatory Visit: Payer: Self-pay | Admitting: "Endocrinology

## 2024-08-11 NOTE — Telephone Encounter (Signed)
 Left a message requesting pt return call to the office.

## 2024-08-12 NOTE — Telephone Encounter (Signed)
 Left a message requesting pt return call to the office.

## 2024-08-12 NOTE — Telephone Encounter (Signed)
 Spoke with pt regarding Mounjaro  injection dose 2.5mg  weekly. Pt states he has tolerated Mounjaro  well and his FBG has been between 98-105. States he would like to continue the 2.5mg  at this time instead of advancing to a higher dose.

## 2024-09-28 ENCOUNTER — Other Ambulatory Visit: Payer: Self-pay | Admitting: "Endocrinology

## 2024-11-02 DIAGNOSIS — E1165 Type 2 diabetes mellitus with hyperglycemia: Secondary | ICD-10-CM | POA: Diagnosis not present

## 2024-11-02 DIAGNOSIS — E039 Hypothyroidism, unspecified: Secondary | ICD-10-CM | POA: Diagnosis not present

## 2024-11-03 LAB — T4, FREE: Free T4: 1.24 ng/dL (ref 0.82–1.77)

## 2024-11-03 LAB — COMPREHENSIVE METABOLIC PANEL WITH GFR
ALT: 20 IU/L (ref 0–44)
AST: 21 IU/L (ref 0–40)
Albumin: 4.8 g/dL (ref 4.1–5.1)
Alkaline Phosphatase: 58 IU/L (ref 47–123)
BUN/Creatinine Ratio: 21 — ABNORMAL HIGH (ref 9–20)
BUN: 37 mg/dL — ABNORMAL HIGH (ref 6–24)
Bilirubin Total: 0.3 mg/dL (ref 0.0–1.2)
CO2: 26 mmol/L (ref 20–29)
Calcium: 9.2 mg/dL (ref 8.7–10.2)
Chloride: 100 mmol/L (ref 96–106)
Creatinine, Ser: 1.79 mg/dL — ABNORMAL HIGH (ref 0.76–1.27)
Globulin, Total: 2.3 g/dL (ref 1.5–4.5)
Glucose: 101 mg/dL — ABNORMAL HIGH (ref 70–99)
Potassium: 5.2 mmol/L (ref 3.5–5.2)
Sodium: 141 mmol/L (ref 134–144)
Total Protein: 7.1 g/dL (ref 6.0–8.5)
eGFR: 46 mL/min/1.73 — ABNORMAL LOW (ref >59)

## 2024-11-03 LAB — TSH: TSH: 3.26 u[IU]/mL (ref 0.450–4.500)

## 2024-11-03 LAB — FIB-4 W/REFLEX TO ELF
FIB-4 Index: 0.99 (ref 0.00–2.67)
Platelets: 228 x10E3/uL (ref 150–450)

## 2024-11-06 ENCOUNTER — Ambulatory Visit: Admitting: "Endocrinology

## 2024-12-04 ENCOUNTER — Ambulatory Visit: Admitting: "Endocrinology

## 2024-12-13 ENCOUNTER — Other Ambulatory Visit: Payer: Self-pay | Admitting: "Endocrinology

## 2025-01-18 ENCOUNTER — Ambulatory Visit: Admitting: "Endocrinology

## 2025-01-22 ENCOUNTER — Ambulatory Visit: Admitting: "Endocrinology
